# Patient Record
Sex: Female | Born: 1980
Health system: Southern US, Community
[De-identification: ages and names within clinical notes are randomized; demographics above are authoritative.]

## PROBLEM LIST (undated history)

## (undated) DIAGNOSIS — B009 Herpesviral infection, unspecified: Secondary | ICD-10-CM

## (undated) DIAGNOSIS — D241 Benign neoplasm of right breast: Secondary | ICD-10-CM

## (undated) DIAGNOSIS — F32A Depression, unspecified: Secondary | ICD-10-CM

## (undated) DIAGNOSIS — F329 Major depressive disorder, single episode, unspecified: Secondary | ICD-10-CM

## (undated) DIAGNOSIS — D242 Benign neoplasm of left breast: Secondary | ICD-10-CM

## (undated) DIAGNOSIS — Z8619 Personal history of other infectious and parasitic diseases: Secondary | ICD-10-CM

## (undated) DIAGNOSIS — O09519 Supervision of elderly primigravida, unspecified trimester: Secondary | ICD-10-CM

## (undated) HISTORY — DX: Personal history of other infectious and parasitic diseases: Z86.19

## (undated) HISTORY — DX: Herpesviral infection, unspecified: B00.9

## (undated) HISTORY — DX: Major depressive disorder, single episode, unspecified: F32.9

## (undated) HISTORY — DX: Benign neoplasm of right breast: D24.2

## (undated) HISTORY — PX: BREAST BIOPSY: SHX20

## (undated) HISTORY — DX: Benign neoplasm of right breast: D24.1

## (undated) HISTORY — DX: Supervision of elderly primigravida, unspecified trimester: O09.519

## (undated) HISTORY — DX: Depression, unspecified: F32.A

---
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
## 1998-03-20 HISTORY — PX: DILATION AND CURETTAGE OF UTERUS: SHX78

## 1999-03-31 ENCOUNTER — Other Ambulatory Visit: Admission: RE | Admit: 1999-03-31 | Discharge: 1999-03-31 | Payer: Self-pay | Admitting: Obstetrics & Gynecology

## 1999-05-17 ENCOUNTER — Other Ambulatory Visit: Admission: RE | Admit: 1999-05-17 | Discharge: 1999-05-17 | Payer: Self-pay | Admitting: Obstetrics & Gynecology

## 1999-05-17 ENCOUNTER — Encounter (INDEPENDENT_AMBULATORY_CARE_PROVIDER_SITE_OTHER): Payer: Self-pay | Admitting: Specialist

## 1999-08-11 ENCOUNTER — Other Ambulatory Visit: Admission: RE | Admit: 1999-08-11 | Discharge: 1999-08-11 | Payer: Self-pay | Admitting: Obstetrics & Gynecology

## 1999-12-08 ENCOUNTER — Encounter (INDEPENDENT_AMBULATORY_CARE_PROVIDER_SITE_OTHER): Payer: Self-pay | Admitting: Specialist

## 1999-12-08 ENCOUNTER — Other Ambulatory Visit: Admission: RE | Admit: 1999-12-08 | Discharge: 1999-12-08 | Payer: Self-pay | Admitting: Obstetrics & Gynecology

## 2000-02-24 ENCOUNTER — Other Ambulatory Visit: Admission: RE | Admit: 2000-02-24 | Discharge: 2000-02-24 | Payer: Self-pay | Admitting: Obstetrics & Gynecology

## 2000-03-20 HISTORY — PX: WISDOM TOOTH EXTRACTION: SHX21

## 2004-10-05 ENCOUNTER — Other Ambulatory Visit: Admission: RE | Admit: 2004-10-05 | Discharge: 2004-10-05 | Payer: Self-pay | Admitting: Obstetrics & Gynecology

## 2006-09-08 ENCOUNTER — Emergency Department (HOSPITAL_COMMUNITY): Admission: EM | Admit: 2006-09-08 | Discharge: 2006-09-08 | Payer: Self-pay | Admitting: Emergency Medicine

## 2008-01-07 ENCOUNTER — Emergency Department (HOSPITAL_COMMUNITY): Admission: EM | Admit: 2008-01-07 | Discharge: 2008-01-07 | Payer: Self-pay | Admitting: Emergency Medicine

## 2008-03-15 ENCOUNTER — Emergency Department (HOSPITAL_COMMUNITY): Admission: EM | Admit: 2008-03-15 | Discharge: 2008-03-15 | Payer: Self-pay | Admitting: Family Medicine

## 2010-07-04 ENCOUNTER — Other Ambulatory Visit (HOSPITAL_COMMUNITY): Payer: Self-pay | Admitting: Obstetrics and Gynecology

## 2010-08-01 ENCOUNTER — Other Ambulatory Visit: Payer: Self-pay

## 2011-01-04 LAB — STREP A DNA PROBE: Group A Strep Probe: NEGATIVE

## 2011-01-04 LAB — POCT RAPID STREP A: Streptococcus, Group A Screen (Direct): NEGATIVE

## 2011-04-06 ENCOUNTER — Other Ambulatory Visit: Payer: Self-pay | Admitting: Obstetrics and Gynecology

## 2011-04-06 DIAGNOSIS — N63 Unspecified lump in unspecified breast: Secondary | ICD-10-CM

## 2011-04-13 ENCOUNTER — Other Ambulatory Visit (HOSPITAL_COMMUNITY): Payer: Self-pay | Admitting: Obstetrics and Gynecology

## 2011-04-13 ENCOUNTER — Ambulatory Visit
Admission: RE | Admit: 2011-04-13 | Discharge: 2011-04-13 | Disposition: A | Discharge status: Home / Self Care | Payer: BC Managed Care – PPO | Source: Ambulatory Visit | Attending: Obstetrics and Gynecology | Admitting: Obstetrics and Gynecology

## 2012-03-20 HISTORY — PX: BREAST SURGERY: SHX581

## 2012-08-15 ENCOUNTER — Encounter (HOSPITAL_COMMUNITY): Payer: Self-pay | Admitting: *Deleted

## 2012-08-15 ENCOUNTER — Emergency Department (INDEPENDENT_AMBULATORY_CARE_PROVIDER_SITE_OTHER)
Admission: EM | Admit: 2012-08-15 | Discharge: 2012-08-15 | Disposition: A | Discharge status: Home / Self Care | Payer: BC Managed Care – PPO | Source: Home / Self Care

## 2012-08-15 DIAGNOSIS — I889 Nonspecific lymphadenitis, unspecified: Secondary | ICD-10-CM

## 2012-08-15 DIAGNOSIS — S1096XA Insect bite of unspecified part of neck, initial encounter: Secondary | ICD-10-CM

## 2012-08-15 DIAGNOSIS — S0006XA Insect bite (nonvenomous) of scalp, initial encounter: Secondary | ICD-10-CM

## 2012-08-15 MED ORDER — DOXYCYCLINE HYCLATE 100 MG PO CAPS: 100.0000 mg | ORAL_CAPSULE | Freq: Two times a day (BID) | ORAL | Status: DC

## 2012-08-15 NOTE — ED Notes (Signed)
Pt. states NP told her to start the antibiotic if fever, increasing pain, more lymph glands swelling or flu like symptoms.

## 2012-08-15 NOTE — ED Provider Notes (Signed)
History     CSN: [id number]  Arrival date & time 08/15/12  1302   None     No chief complaint on file.   (Consider location/radiation/quality/duration/timing/severity/associated sxs/prior treatment) HPI Comments: 32 year old female presents with the complaint of insidious onset of a tender knot on her occipital scalp. It started approximately 2 days ago. It was larger yesterday however through today it is become smaller. Is associated with local tenderness and mild occipital headache. Interestingly, she states she removed a tick from her scalp approximately 10 cm superior to the area of the nodule. She denies symptoms of illness such as fever, chills, rash, cough, chest pain, shortness of breath or other symptoms. States she feels generally well.   No past medical history on file.  No past surgical history on file.  No family history on file.  History  Substance Use Topics  . Smoking status: Not on file  . Smokeless tobacco: Not on file  . Alcohol Use: Not on file    OB History   No data available      Review of Systems  Constitutional: Negative.   HENT:       There is a small area of tenderness associated with a one half centimeter slightly raised nodule. There is no visible discoloration or other abnormalities of the scalp. No tenderness, pain or other lesions to the remainder of the scalp.  Eyes: Negative.   Respiratory: Negative.   Gastrointestinal: Negative.   Skin: Negative for color change, pallor and rash.  Allergic/Immunologic: Negative for immunocompromised state.  Neurological: Negative.   Psychiatric/Behavioral: Negative.     Allergies  Review of patient's allergies indicates not on file.  Home Medications   Current Outpatient Rx  Name  Route  Sig  Dispense  Refill  . doxycycline (VIBRAMYCIN) 100 MG capsule   Oral   Take 1 capsule (100 mg total) by mouth 2 (two) times daily.   20 capsule   0     BP 123/77  Pulse 68  Temp(Src) 98.9 F (37.2  C) (Oral)  Resp 17  SpO2 97%  Physical Exam  Nursing note and vitals reviewed. Constitutional: She is oriented to person, place, and time. She appears well-developed and well-nourished. No distress.  HENT:  Head: Normocephalic and atraumatic.  Mouth/Throat: Oropharynx is clear and moist. No oropharyngeal exudate.  Bilateral TMs are normal  Eyes: EOM are normal. Pupils are equal, round, and reactive to light.  Neck: Normal range of motion. Neck supple.  No posterior or  anterior cervical lymphadenopathy .  Cardiovascular: Normal rate.   Pulmonary/Chest: Effort normal and breath sounds normal.  Musculoskeletal: Normal range of motion. She exhibits no edema.  Lymphadenopathy:    She has no cervical adenopathy.  Neurological: She is alert and oriented to person, place, and time. She exhibits normal muscle tone.  Skin: Skin is warm and dry. No rash noted. No erythema.  Psychiatric: She has a normal mood and affect.    ED Course  Procedures (including critical care time)  Labs Reviewed - No data to display No results found.   1. Occipital lymphadenitis   2. Tick bite of scalp, initial encounter       MDM  This is most likely a reactive lymphadenitis of a solitary occipital node due to a recent tick bite of the scalp. She may apply warm compresses. She is also instructed on symptoms of tick born illness and should seek medical attention if she becomes ill develops fever or has  any other problems. I gave her a prescription for doxycycline 100 mg twice a day should there be additional lymph nodes 2 enlarged or the solitary lymph node enlarges becomes red or more painful. She is also start the antibiotic if she becomes ill or develops fever in addition to coming back for recheck.  Hayden Rasmussen, NP 08/15/12 [phone]

## 2012-08-15 NOTE — ED Notes (Signed)
C/o  knot on back of head on R side at the base of the skull onset yesterday evening.  States it is painful and nagging headache.  Had a tick bite last Friday 5/23 3 inches above the knot.Marland Kitchen

## 2012-08-15 NOTE — ED Provider Notes (Signed)
Medical screening examination/treatment/procedure(s) were performed by non-physician practitioner and as supervising physician I was immediately available for consultation/collaboration.  Leslee Home, M.D.  Reuben Likes, MD 08/15/12 2030

## 2012-08-26 ENCOUNTER — Other Ambulatory Visit: Payer: Self-pay | Admitting: Obstetrics and Gynecology

## 2012-08-26 ENCOUNTER — Emergency Department (INDEPENDENT_AMBULATORY_CARE_PROVIDER_SITE_OTHER)
Admission: EM | Admit: 2012-08-26 | Discharge: 2012-08-26 | Disposition: A | Discharge status: Home / Self Care | Payer: BC Managed Care – PPO | Source: Home / Self Care

## 2012-08-26 ENCOUNTER — Encounter (HOSPITAL_COMMUNITY): Payer: Self-pay | Admitting: Emergency Medicine

## 2012-08-26 DIAGNOSIS — T148XXA Other injury of unspecified body region, initial encounter: Secondary | ICD-10-CM

## 2012-08-26 DIAGNOSIS — S29012A Strain of muscle and tendon of back wall of thorax, initial encounter: Secondary | ICD-10-CM

## 2012-08-26 DIAGNOSIS — S239XXA Sprain of unspecified parts of thorax, initial encounter: Secondary | ICD-10-CM

## 2012-08-26 DIAGNOSIS — N63 Unspecified lump in unspecified breast: Secondary | ICD-10-CM

## 2012-08-26 MED ORDER — DICLOFENAC POTASSIUM 50 MG PO TABS: 50.0000 mg | ORAL_TABLET | Freq: Three times a day (TID) | ORAL | Status: DC

## 2012-08-26 NOTE — ED Provider Notes (Signed)
History     CSN: [id number]  Arrival date & time 08/26/12  1013   First MD Initiated Contact with Patient 08/26/12 1032      Chief Complaint  Patient presents with  . Shoulder Pain    (Consider location/radiation/quality/duration/timing/severity/associated sxs/prior treatment) HPI Comments: Pleasant 32 year old female states she felt a pinch in her left upper back last night while lying in bed. It did not seem to bother her much at that time however this morning when she was getting dressed she reached behind her back and felt a pop in her left upper back. She is complaining of pain primarily to the upper parathoracic musculature. The movement but primarily produces pain is stretching the left arm forward and separating the scapula. Denies other injury or trauma.   History reviewed. No pertinent past medical history.  Past Surgical History  Procedure Laterality Date  . Finger surgery Right 1986    R index finger -tip amputed in door. Sewn up.  . Wisdom tooth extraction  2002  . Dilation and curettage of uterus  2000    Family History  Problem Relation Age of Onset  . Diabetes Mother   . Heart disease Father     6 vessel bypass, 2 aneurysms ( aorta and abd.)    History  Substance Use Topics  . Smoking status: Current Every Day Smoker -- 0.50 packs/day    Types: Cigarettes  . Smokeless tobacco: Not on file  . Alcohol Use: 2.4 oz/week    4 Cans of beer per week    OB History   Grav Para Term Preterm Abortions TAB SAB Ect Mult Living                  Review of Systems  Constitutional: Negative for fever, chills and activity change.  HENT: Negative.   Respiratory: Negative.   Cardiovascular: Negative.   Musculoskeletal:       As per HPI  Skin: Negative for color change, pallor and rash.  Neurological: Negative.     Allergies  Review of patient's allergies indicates no known allergies.  Home Medications   Current Outpatient Rx  Name  Route  Sig  Dispense   Refill  . ibuprofen (ADVIL,MOTRIN) 200 MG tablet   Oral   Take 200 mg by mouth every 6 (six) hours as needed for pain.         Marland Kitchen diclofenac (CATAFLAM) 50 MG tablet   Oral   Take 1 tablet (50 mg total) by mouth 3 (three) times daily.   20 tablet   0   . doxycycline (VIBRAMYCIN) 100 MG capsule   Oral   Take 1 capsule (100 mg total) by mouth 2 (two) times daily.   20 capsule   0   . norgestimate-ethinyl estradiol (ORTHO-CYCLEN,SPRINTEC,PREVIFEM) 0.25-35 MG-MCG tablet   Oral   Take 1 tablet by mouth daily.           BP 144/93  Pulse 62  Temp(Src) 98.7 F (37.1 C) (Oral)  Resp 16  SpO2 100%  LMP 07/30/2012  Physical Exam  Constitutional: She is oriented to person, place, and time. She appears well-developed and well-nourished. No distress.  HENT:  Head: Normocephalic and atraumatic.  Eyes: EOM are normal.  Neck: Normal range of motion. Neck supple.  Cardiovascular: Normal rate.   Pulmonary/Chest: Effort normal. No respiratory distress.  Musculoskeletal:  Tenderness to the upper mid para thoracic musculature on the left. There is tenderness involving the medial aspect of the trapezius  and the rhomboid muscle. No overlying swelling for color change.  Neurological: She is alert and oriented to person, place, and time. No cranial nerve deficit.  Skin: Skin is warm and dry.  Psychiatric: She has a normal mood and affect.    ED Course  Procedures (including critical care time)  Labs Reviewed - No data to display No results found.   1. Upper back strain, initial encounter   2. Muscle strain       MDM  Stretches as demonstrated Heat Limit activity that exacerbates pain cataflam 50 tid prn , with food.         Hayden Rasmussen, NP 08/26/12 1128

## 2012-08-26 NOTE — ED Notes (Signed)
Noticed a "pinch" last night : noticed this on left back, between spine and shoulder blade.  This am was scrubbing back with right arm over head, brought right arm down to her side and had "shooting " pain in left back.  Patient is right handed.  No known injury, no different activities.  Did sleep in a different bed Friday night

## 2012-08-26 NOTE — ED Notes (Signed)
Printed work note

## 2012-08-26 NOTE — ED Provider Notes (Signed)
Medical screening examination/treatment/procedure(s) were performed by non-physician practitioner and as supervising physician I was immediately available for consultation/collaboration.   MORENO-COLL,Lorna Strother; MD  Tess Potts Moreno-Coll, MD 08/26/12 1545 

## 2012-09-12 ENCOUNTER — Other Ambulatory Visit: Payer: BC Managed Care – PPO

## 2012-09-13 ENCOUNTER — Ambulatory Visit
Admission: RE | Admit: 2012-09-13 | Discharge: 2012-09-13 | Disposition: A | Discharge status: Home / Self Care | Payer: BC Managed Care – PPO | Source: Ambulatory Visit | Attending: Obstetrics and Gynecology | Admitting: Obstetrics and Gynecology

## 2012-09-13 ENCOUNTER — Other Ambulatory Visit: Payer: Self-pay | Admitting: Obstetrics and Gynecology

## 2012-09-13 DIAGNOSIS — N63 Unspecified lump in unspecified breast: Secondary | ICD-10-CM

## 2012-09-16 ENCOUNTER — Encounter (HOSPITAL_COMMUNITY): Payer: Self-pay | Admitting: Emergency Medicine

## 2012-09-16 ENCOUNTER — Emergency Department (HOSPITAL_COMMUNITY)
Admission: EM | Admit: 2012-09-16 | Discharge: 2012-09-16 | Discharge status: Against Medical Advice | Payer: BC Managed Care – PPO | Attending: Emergency Medicine | Admitting: Emergency Medicine

## 2012-09-16 DIAGNOSIS — L500 Allergic urticaria: Secondary | ICD-10-CM | POA: Insufficient documentation

## 2012-09-16 NOTE — ED Notes (Signed)
PT. REPORTS ALLERGIC REACTION TO INSECT BITE THIS EVENING , PRESENTS WITH RASHES  /HIVES AT BACK AND THIGHS . RESPIRATIONS UNLABORED / AIRWAY INTACT .

## 2012-09-24 ENCOUNTER — Ambulatory Visit
Admission: RE | Admit: 2012-09-24 | Discharge: 2012-09-24 | Disposition: A | Discharge status: Home / Self Care | Payer: BC Managed Care – PPO | Source: Ambulatory Visit | Attending: Obstetrics and Gynecology | Admitting: Obstetrics and Gynecology

## 2012-09-24 ENCOUNTER — Other Ambulatory Visit: Payer: Self-pay | Admitting: Obstetrics and Gynecology

## 2012-09-24 DIAGNOSIS — N63 Unspecified lump in unspecified breast: Secondary | ICD-10-CM

## 2012-09-27 ENCOUNTER — Other Ambulatory Visit: Payer: BC Managed Care – PPO

## 2013-01-23 ENCOUNTER — Other Ambulatory Visit: Payer: Self-pay | Admitting: Obstetrics and Gynecology

## 2013-04-22 ENCOUNTER — Encounter (HOSPITAL_COMMUNITY): Payer: Self-pay | Admitting: Emergency Medicine

## 2013-04-22 ENCOUNTER — Emergency Department (INDEPENDENT_AMBULATORY_CARE_PROVIDER_SITE_OTHER)
Admission: EM | Admit: 2013-04-22 | Discharge: 2013-04-22 | Disposition: A | Discharge status: Home / Self Care | Payer: BC Managed Care – PPO | Source: Home / Self Care | Attending: Family Medicine | Admitting: Family Medicine

## 2013-04-22 DIAGNOSIS — IMO0002 Reserved for concepts with insufficient information to code with codable children: Secondary | ICD-10-CM

## 2013-04-22 DIAGNOSIS — S29011A Strain of muscle and tendon of front wall of thorax, initial encounter: Secondary | ICD-10-CM

## 2013-04-22 MED ORDER — DICLOFENAC 35 MG PO CAPS: 35.0000 mg | ORAL_CAPSULE | Freq: Three times a day (TID) | ORAL | Status: DC

## 2013-04-22 NOTE — ED Notes (Signed)
C/o mid chest strain States pain radiates under armpits cold compresses and advil as tx Does pick up heavy items at work

## 2013-04-22 NOTE — ED Provider Notes (Signed)
CSN: [id number]     Arrival date & time 04/22/13  1559 History   First MD Initiated Contact with Patient 04/22/13 1637     Chief Complaint  Patient presents with  . chest strain    (Consider location/radiation/quality/duration/timing/severity/associated sxs/prior Treatment) Patient is a 32 y.o. female presenting with chest pain. The history is provided by the patient.  Chest Pain Pain location:  L chest and R chest Pain quality: sharp   Pain radiates to:  Upper back Pain radiates to the back: yes   Pain severity:  Mild Onset quality:  Gradual Duration:  2 weeks Progression:  Unchanged Context: lifting and raising an arm   Relieved by:  Certain positions Worsened by:  Certain positions Ineffective treatments:  None tried Associated symptoms: cough   Associated symptoms: no fever, no nausea, no palpitations, no shortness of breath and not vomiting   Risk factors: smoking     History reviewed. No pertinent past medical history. Past Surgical History  Procedure Laterality Date  . Finger surgery Right 1986    R index finger -tip amputed in door. Sewn up.  . Wisdom tooth extraction  2002  . Dilation and curettage of uterus  2000   Family History  Problem Relation Age of Onset  . Diabetes Mother   . Heart disease Father     6 vessel bypass, 2 aneurysms ( aorta and abd.)   History  Substance Use Topics  . Smoking status: Current Every Day Smoker -- 0.50 packs/day    Types: Cigarettes  . Smokeless tobacco: Not on file  . Alcohol Use: 2.4 oz/week    4 Cans of beer per week   OB History   Grav Para Term Preterm Abortions TAB SAB Ect Mult Living                 Review of Systems  Constitutional: Negative.  Negative for fever.  HENT: Negative.   Respiratory: Positive for cough. Negative for shortness of breath and wheezing.   Cardiovascular: Positive for chest pain. Negative for palpitations and leg swelling.  Gastrointestinal: Negative for nausea and vomiting.     Allergies  Review of patient's allergies indicates no known allergies.  Home Medications   Current Outpatient Rx  Name  Route  Sig  Dispense  Refill  . Diclofenac (ZORVOLEX) 35 MG CAPS   Oral   Take 35 mg by mouth 3 (three) times daily after meals.   30 capsule   5   . norgestimate-ethinyl estradiol (ORTHO-CYCLEN,SPRINTEC,PREVIFEM) 0.25-35 MG-MCG tablet   Oral   Take 1 tablet by mouth at bedtime.           BP 123/77  Pulse 66  Temp(Src) 98.7 F (37.1 C) (Oral)  Resp 16  SpO2 100%  LMP 04/21/2013 Physical Exam  Nursing note and vitals reviewed. Constitutional: She is oriented to person, place, and time. She appears well-developed and well-nourished.  HENT:  Right Ear: External ear normal.  Left Ear: External ear normal.  Mouth/Throat: Oropharynx is clear and moist.  Neck: Normal range of motion. Neck supple.  Cardiovascular: Normal rate, regular rhythm, normal heart sounds and intact distal pulses.   Pulmonary/Chest: Effort normal and breath sounds normal. No respiratory distress. She has no wheezes. She has no rales. She exhibits tenderness.  Abdominal: Soft. Bowel sounds are normal. There is no tenderness.  Lymphadenopathy:    She has no cervical adenopathy.  Neurological: She is alert and oriented to person, place, and time.  Skin: Skin  is warm and dry.    ED Course  Procedures (including critical care time) Labs Review Labs Reviewed - No data to display Imaging Review No results found.    MDM   1. Muscle strain of chest wall        Billy Fischer, MD 04/22/13 [phone]

## 2013-08-15 ENCOUNTER — Ambulatory Visit: Payer: Self-pay | Admitting: Podiatrist

## 2013-10-21 LAB — OB RESULTS CONSOLE RPR: RPR: NONREACTIVE

## 2013-10-21 LAB — OB RESULTS CONSOLE ABO/RH: RH Type: POSITIVE

## 2013-10-21 LAB — OB RESULTS CONSOLE ANTIBODY SCREEN: ANTIBODY SCREEN: NEGATIVE

## 2013-10-21 LAB — OB RESULTS CONSOLE HIV ANTIBODY (ROUTINE TESTING): HIV: NONREACTIVE

## 2013-10-21 LAB — OB RESULTS CONSOLE HEPATITIS B SURFACE ANTIGEN: Hepatitis B Surface Ag: NEGATIVE

## 2013-10-21 LAB — OB RESULTS CONSOLE GC/CHLAMYDIA
Chlamydia: NEGATIVE
GC PROBE AMP, GENITAL: NEGATIVE

## 2013-10-21 LAB — OB RESULTS CONSOLE RUBELLA ANTIBODY, IGM: RUBELLA: IMMUNE

## 2014-05-27 ENCOUNTER — Telehealth (HOSPITAL_COMMUNITY): Payer: Self-pay | Admitting: *Deleted

## 2014-05-27 ENCOUNTER — Encounter (HOSPITAL_COMMUNITY): Payer: Self-pay | Admitting: *Deleted

## 2014-05-27 LAB — OB RESULTS CONSOLE GBS: GBS: NEGATIVE

## 2014-05-27 NOTE — Telephone Encounter (Signed)
Preadmission screen  

## 2014-05-29 ENCOUNTER — Inpatient Hospital Stay (HOSPITAL_COMMUNITY): Admission: AD | Admit: 2014-05-29 | Payer: Self-pay | Source: Ambulatory Visit | Admitting: Obstetrics and Gynecology

## 2014-06-04 ENCOUNTER — Encounter (HOSPITAL_COMMUNITY): Payer: Self-pay

## 2014-06-04 ENCOUNTER — Inpatient Hospital Stay (HOSPITAL_COMMUNITY)
Admission: RE | Admit: 2014-06-04 | Discharge: 2014-06-07 | DRG: 775 | Disposition: A | Discharge status: Home / Self Care | Payer: BLUE CROSS/BLUE SHIELD | Source: Ambulatory Visit | Attending: Obstetrics and Gynecology | Admitting: Obstetrics and Gynecology

## 2014-06-04 DIAGNOSIS — Z87891 Personal history of nicotine dependence: Secondary | ICD-10-CM | POA: Diagnosis not present

## 2014-06-04 DIAGNOSIS — O09523 Supervision of elderly multigravida, third trimester: Secondary | ICD-10-CM | POA: Diagnosis not present

## 2014-06-04 DIAGNOSIS — O480 Post-term pregnancy: Principal | ICD-10-CM | POA: Diagnosis present

## 2014-06-04 DIAGNOSIS — Z3A40 40 weeks gestation of pregnancy: Secondary | ICD-10-CM | POA: Diagnosis present

## 2014-06-04 DIAGNOSIS — Z833 Family history of diabetes mellitus: Secondary | ICD-10-CM

## 2014-06-04 LAB — CBC
HCT: 34.6 % — ABNORMAL LOW (ref 36.0–46.0)
Hemoglobin: 11.7 g/dL — ABNORMAL LOW (ref 12.0–15.0)
MCH: 30.1 pg (ref 26.0–34.0)
MCHC: 33.8 g/dL (ref 30.0–36.0)
MCV: 88.9 fL (ref 78.0–100.0)
PLATELETS: 295 10*3/uL (ref 150–400)
RBC: 3.89 MIL/uL (ref 3.87–5.11)
RDW: 14 % (ref 11.5–15.5)
WBC: 9.4 10*3/uL (ref 4.0–10.5)

## 2014-06-04 MED ORDER — OXYTOCIN 40 UNITS IN LACTATED RINGERS INFUSION - SIMPLE MED
62.5000 mL/h | INTRAVENOUS | Status: DC
  Filled 2014-06-04: qty 1000

## 2014-06-04 MED ORDER — ACETAMINOPHEN 325 MG PO TABS: 650.0000 mg | ORAL_TABLET | ORAL | Status: DC | PRN

## 2014-06-04 MED ORDER — OXYCODONE-ACETAMINOPHEN 5-325 MG PO TABS: 1.0000 | ORAL_TABLET | ORAL | Status: DC | PRN

## 2014-06-04 MED ORDER — LACTATED RINGERS IV SOLN
INTRAVENOUS | Status: DC
  Administered 2014-06-04: 21:00:00 via INTRAVENOUS

## 2014-06-04 MED ORDER — FLEET ENEMA 7-19 GM/118ML RE ENEM: 1.0000 | ENEMA | RECTAL | Status: DC | PRN

## 2014-06-04 MED ORDER — OXYCODONE-ACETAMINOPHEN 5-325 MG PO TABS
2.0000 | ORAL_TABLET | ORAL | Status: DC | PRN
Start: 2014-06-04 — End: 2014-06-06

## 2014-06-04 MED ORDER — MISOPROSTOL 25 MCG QUARTER TABLET
25.0000 ug | ORAL_TABLET | ORAL | Status: DC | PRN
  Administered 2014-06-04 – 2014-06-05 (×3): 25 ug via VAGINAL
  Filled 2014-06-04: qty 1
  Filled 2014-06-04 (×3): qty 0.25

## 2014-06-04 MED ORDER — ONDANSETRON HCL 4 MG/2ML IJ SOLN: 4.0000 mg | Freq: Four times a day (QID) | INTRAMUSCULAR | Status: DC | PRN

## 2014-06-04 MED ORDER — OXYTOCIN BOLUS FROM INFUSION: 500.0000 mL | INTRAVENOUS | Status: DC

## 2014-06-04 MED ORDER — BUTORPHANOL TARTRATE 1 MG/ML IJ SOLN: 1.0000 mg | INTRAMUSCULAR | Status: DC | PRN

## 2014-06-04 MED ORDER — TERBUTALINE SULFATE 1 MG/ML IJ SOLN: 0.2500 mg | Freq: Once | INTRAMUSCULAR | Status: AC | PRN

## 2014-06-04 MED ORDER — ZOLPIDEM TARTRATE 5 MG PO TABS
5.0000 mg | ORAL_TABLET | Freq: Every evening | ORAL | Status: DC | PRN
  Administered 2014-06-05: 5 mg via ORAL
  Filled 2014-06-04: qty 1

## 2014-06-04 MED ORDER — LACTATED RINGERS IV SOLN: 500.0000 mL | INTRAVENOUS | Status: DC | PRN

## 2014-06-04 MED ORDER — CITRIC ACID-SODIUM CITRATE 334-500 MG/5ML PO SOLN
30.0000 mL | ORAL | Status: DC | PRN
  Administered 2014-06-05: 30 mL via ORAL
  Filled 2014-06-04: qty 15

## 2014-06-04 MED ORDER — LIDOCAINE HCL (PF) 1 % IJ SOLN
30.0000 mL | INTRAMUSCULAR | Status: DC | PRN
  Filled 2014-06-04: qty 30

## 2014-06-04 NOTE — H&P (Signed)
Maria Vega is a 34 y.o. female presenting for IOL for post dates. Denies H/A and vision change. Maternal Medical History:  Fetal activity: Perceived fetal activity is normal.      OB History    Gravida Para Term Preterm AB TAB SAB Ectopic Multiple Living   2    1          Past Medical History  Diagnosis Date  . Herpes   . Hx of varicella   . History of shingles   . Bilateral fibroadenomas of breasts   . AMA (advanced maternal age) primigravida 101+    Past Surgical History  Procedure Laterality Date  . Finger surgery Right 1986    R index finger -tip amputed in door. Sewn up.  . Wisdom tooth extraction  2002  . Dilation and curettage of uterus  2000   Family History: family history includes Cancer in her father and mother; Diabetes in her mother; Heart disease in her father. Social History:  reports that she quit smoking about 7 months ago. Her smoking use included Cigarettes. She smoked 0.50 packs per day. She does not have any smokeless tobacco history on file. She reports that she drinks about 2.4 oz of alcohol per week. She reports that she does not use illicit drugs.   Prenatal Transfer Tool  Maternal Diabetes: No Genetic Screening: Normal Maternal Ultrasounds/Referrals: Normal Fetal Ultrasounds or other Referrals:  None Maternal Substance Abuse:  No Significant Maternal Medications:  None Significant Maternal Lab Results:  None Other Comments:  None  Review of Systems  Eyes: Negative for blurred vision.  Gastrointestinal: Negative for abdominal pain.  Neurological: Negative for headaches.    Dilation: Fingertip Effacement (%): Thick Station: -3 Exam by:: [email or web address],VE Blood pressure 131/86, pulse 92, temperature 97.7 F (36.5 C), temperature source Oral, resp. rate 16, height 5\' 7"  (1.702 m), weight 209 lb (94.802 kg), last menstrual period 08/22/2013.   Fetal Exam Fetal State Assessment: Category I - tracings are normal.     Physical Exam   Cardiovascular: Normal rate.   Respiratory: Effort normal.    Prenatal labs: ABO, Rh: A/Positive/-- (08/04 0000) Antibody: Negative (08/04 0000) Rubella: Immune (08/04 0000) RPR: Nonreactive (08/04 0000)  HBsAg: Negative (08/04 0000)  HIV: Non-reactive (08/04 0000)  GBS: Negative (03/09 0000)   Assessment/Plan: 35 yo G2P0 at 27 6/7 weeks  D/W patient and husband 2 stage induction with risks including fetal distress, emergency cesarean section, failed induction. All questions answered. Patient states she understands and agrees   Benjiman Core E 06/04/2014, 9:39 PM

## 2014-06-05 ENCOUNTER — Inpatient Hospital Stay (HOSPITAL_COMMUNITY): Payer: BLUE CROSS/BLUE SHIELD | Admitting: Anesthesiology

## 2014-06-05 LAB — ABO/RH: ABO/RH(D): A POS

## 2014-06-05 LAB — TYPE AND SCREEN
ABO/RH(D): A POS
ANTIBODY SCREEN: NEGATIVE

## 2014-06-05 LAB — RPR: RPR Ser Ql: NONREACTIVE

## 2014-06-05 MED ORDER — LACTATED RINGERS IV SOLN
500.0000 mL | Freq: Once | INTRAVENOUS | Status: AC
  Administered 2014-06-05: 500 mL via INTRAVENOUS

## 2014-06-05 MED ORDER — EPHEDRINE 5 MG/ML INJ
10.0000 mg | INTRAVENOUS | Status: DC | PRN
  Filled 2014-06-05: qty 2

## 2014-06-05 MED ORDER — FENTANYL 2.5 MCG/ML BUPIVACAINE 1/10 % EPIDURAL INFUSION (WH - ANES)
14.0000 mL/h | INTRAMUSCULAR | Status: DC | PRN
  Administered 2014-06-05 (×2): 14 mL/h via EPIDURAL
  Filled 2014-06-05 (×2): qty 125

## 2014-06-05 MED ORDER — DIPHENHYDRAMINE HCL 50 MG/ML IJ SOLN: 12.5000 mg | INTRAMUSCULAR | Status: DC | PRN

## 2014-06-05 MED ORDER — PHENYLEPHRINE 40 MCG/ML (10ML) SYRINGE FOR IV PUSH (FOR BLOOD PRESSURE SUPPORT)
80.0000 ug | PREFILLED_SYRINGE | INTRAVENOUS | Status: DC | PRN
  Filled 2014-06-05: qty 2
  Filled 2014-06-05: qty 20

## 2014-06-05 MED ORDER — OXYTOCIN 40 UNITS IN LACTATED RINGERS INFUSION - SIMPLE MED
1.0000 m[IU]/min | INTRAVENOUS | Status: DC
  Administered 2014-06-05: 2 m[IU]/min via INTRAVENOUS

## 2014-06-05 MED ORDER — TERBUTALINE SULFATE 1 MG/ML IJ SOLN: 0.2500 mg | Freq: Once | INTRAMUSCULAR | Status: AC | PRN

## 2014-06-05 MED ORDER — FENTANYL 2.5 MCG/ML BUPIVACAINE 1/10 % EPIDURAL INFUSION (WH - ANES)
INTRAMUSCULAR | Status: DC | PRN
  Administered 2014-06-05: 14 mL/h via EPIDURAL

## 2014-06-05 MED ORDER — PHENYLEPHRINE 40 MCG/ML (10ML) SYRINGE FOR IV PUSH (FOR BLOOD PRESSURE SUPPORT)
80.0000 ug | PREFILLED_SYRINGE | INTRAVENOUS | Status: DC | PRN
  Filled 2014-06-05: qty 2

## 2014-06-05 MED ORDER — LIDOCAINE HCL (PF) 1 % IJ SOLN
INTRAMUSCULAR | Status: DC | PRN
  Administered 2014-06-05 (×2): 4 mL

## 2014-06-05 NOTE — Progress Notes (Signed)
Pt comfortable w/ epidural.  Feeling a bit of pressure  FHT reassuring w/ good variablility.  Occasional decel Toco Q2 Cvx 9.5cm/C/+1  A/P:  Will recheck in 30 min and start pushing if complete.

## 2014-06-05 NOTE — Anesthesia Preprocedure Evaluation (Signed)
Anesthesia Evaluation  Patient identified by MRN, date of birth, ID band Patient awake    Reviewed: Allergy & Precautions, NPO status , Patient's Chart, lab work & pertinent test results  History of Anesthesia Complications Negative for: history of anesthetic complications  Airway Mallampati: II  TM Distance: >3 FB Neck ROM: Full    Dental no notable dental hx. (+) Dental Advisory Given   Pulmonary former smoker,  breath sounds clear to auscultation  Pulmonary exam normal       Cardiovascular negative cardio ROS  Rhythm:Regular Rate:Normal     Neuro/Psych negative neurological ROS  negative psych ROS   GI/Hepatic negative GI ROS, Neg liver ROS,   Endo/Other  obesity  Renal/GU negative Renal ROS  negative genitourinary   Musculoskeletal negative musculoskeletal ROS (+)   Abdominal   Peds negative pediatric ROS (+)  Hematology negative hematology ROS (+)   Anesthesia Other Findings   Reproductive/Obstetrics (+) Pregnancy                             Anesthesia Physical Anesthesia Plan  ASA: II  Anesthesia Plan: Epidural   Post-op Pain Management:    Induction:   Airway Management Planned:   Additional Equipment:   Intra-op Plan:   Post-operative Plan:   Informed Consent: I have reviewed the patients History and Physical, chart, labs and discussed the procedure including the risks, benefits and alternatives for the proposed anesthesia with the patient or authorized representative who has indicated his/her understanding and acceptance.   Dental advisory given  Plan Discussed with: CRNA  Anesthesia Plan Comments:         Anesthesia Quick Evaluation

## 2014-06-05 NOTE — Progress Notes (Signed)
Pt comfortable w/ epidural.  FHT with period of repeatative late decels.  Pitocin turned off and FHT now reassuring w/ good BTBV & accels  FHT reassuring now without pitocin Toco quiet now/irregular Cvx 3/90/-1  A/P:  Plan to turn pitocin back on.  If late decels return - rec c-section. Otherwise exp mngt, watch FHT closely Plan of care d/w pt

## 2014-06-05 NOTE — Progress Notes (Signed)
Pt with mild cramping after 2 doses cytotec  FHT reassuring, cat 1 Toco irregular Cvx 1cm  A/P:  2 stage IOL, postdates Cervix unfavorable, continue cytotec Epidural prn

## 2014-06-05 NOTE — Anesthesia Procedure Notes (Signed)
Epidural Patient location during procedure: OB Start time: 06/05/2014 2:50 PM  Staffing Anesthesiologist: Lauretta Grill Performed by: anesthesiologist   Preanesthetic Checklist Completed: patient identified, site marked, surgical consent, pre-op evaluation, timeout performed, IV checked, risks and benefits discussed and monitors and equipment checked  Epidural Patient position: sitting Prep: site prepped and draped and DuraPrep Patient monitoring: continuous pulse ox and blood pressure Approach: midline Location: L3-L4 Injection technique: LOR saline  Needle:  Needle type: Tuohy  Needle gauge: 17 G Needle length: 9 cm and 9 Needle insertion depth: 7 cm Catheter type: closed end flexible Catheter size: 19 Gauge Catheter at skin depth: 12 cm Test dose: negative  Assessment Events: blood not aspirated, injection not painful, no injection resistance, negative IV test and no paresthesia  Additional Notes Patient identified. Risks/Benefits/Options discussed with patient including but not limited to bleeding, infection, nerve damage, paralysis, failed block, incomplete pain control, headache, blood pressure changes, nausea, vomiting, reactions to medication both or allergic, itching and postpartum back pain. Confirmed with bedside nurse the patient's most recent platelet count. Confirmed with patient that they are not currently taking any anticoagulation, have any bleeding history or any family history of bleeding disorders. Patient expressed understanding and wished to proceed. All questions were answered. Sterile technique was used throughout the entire procedure. Please see nursing notes for vital signs. Test dose was given through epidural catheter and negative prior to continuing to dose epidural or start infusion. Warning signs of high block given to the patient including shortness of breath, tingling/numbness in hands, complete motor block, or any concerning symptoms with instructions to  call for help. Patient was given instructions on fall risk and not to get out of bed. All questions and concerns addressed with instructions to call with any issues or inadequate analgesia.

## 2014-06-05 NOTE — Progress Notes (Signed)
SVD of vigorous female infant w/ apgars of 9,9.  Placenta delivered spontaneous w/ 3VC.   2nd degree lac repaired w/ 3-0 vicryl rapide.  Fundus firm.  EBL 2nd .

## 2014-06-05 NOTE — Plan of Care (Signed)
Dr. Julien Girt here aware of pt UC pattern OK to place next cytotec

## 2014-06-05 NOTE — Progress Notes (Signed)
Pt uncomfortable with ctx  FHT reassuring Toco Q2-3 Cvx 2/70/-2 AROM - clear  A/P:  Plan for epidural Pitocin prn

## 2014-06-06 ENCOUNTER — Encounter (HOSPITAL_COMMUNITY): Payer: Self-pay

## 2014-06-06 LAB — CBC
HCT: 31.4 % — ABNORMAL LOW (ref 36.0–46.0)
Hemoglobin: 10.6 g/dL — ABNORMAL LOW (ref 12.0–15.0)
MCH: 30 pg (ref 26.0–34.0)
MCHC: 33.8 g/dL (ref 30.0–36.0)
MCV: 89 fL (ref 78.0–100.0)
Platelets: 225 10*3/uL (ref 150–400)
RBC: 3.53 MIL/uL — ABNORMAL LOW (ref 3.87–5.11)
RDW: 13.9 % (ref 11.5–15.5)
WBC: 15.4 10*3/uL — ABNORMAL HIGH (ref 4.0–10.5)

## 2014-06-06 MED ORDER — IBUPROFEN 600 MG PO TABS
600.0000 mg | ORAL_TABLET | Freq: Four times a day (QID) | ORAL | Status: DC
  Administered 2014-06-06 – 2014-06-07 (×6): 600 mg via ORAL
  Filled 2014-06-06 (×6): qty 1

## 2014-06-06 MED ORDER — SENNOSIDES-DOCUSATE SODIUM 8.6-50 MG PO TABS
2.0000 | ORAL_TABLET | ORAL | Status: DC
  Administered 2014-06-06 (×2): 2 via ORAL
  Filled 2014-06-06 (×2): qty 2

## 2014-06-06 MED ORDER — MEASLES, MUMPS & RUBELLA VAC ~~LOC~~ INJ
0.5000 mL | INJECTION | Freq: Once | SUBCUTANEOUS | Status: DC
  Filled 2014-06-06: qty 0.5

## 2014-06-06 MED ORDER — PRENATAL MULTIVITAMIN CH
1.0000 | ORAL_TABLET | Freq: Every day | ORAL | Status: DC
  Administered 2014-06-06 – 2014-06-07 (×2): 1 via ORAL
  Filled 2014-06-06 (×2): qty 1

## 2014-06-06 MED ORDER — SIMETHICONE 80 MG PO CHEW: 80.0000 mg | CHEWABLE_TABLET | ORAL | Status: DC | PRN

## 2014-06-06 MED ORDER — OXYCODONE-ACETAMINOPHEN 5-325 MG PO TABS
1.0000 | ORAL_TABLET | ORAL | Status: DC | PRN
Start: 2014-06-06 — End: 2014-06-07
  Administered 2014-06-06 – 2014-06-07 (×2): 1 via ORAL
  Filled 2014-06-06 (×2): qty 1

## 2014-06-06 MED ORDER — BENZOCAINE-MENTHOL 20-0.5 % EX AERO
1.0000 "application " | INHALATION_SPRAY | CUTANEOUS | Status: DC | PRN
  Administered 2014-06-06: 1 via TOPICAL
  Filled 2014-06-06: qty 56

## 2014-06-06 MED ORDER — ONDANSETRON HCL 4 MG/2ML IJ SOLN
4.0000 mg | INTRAMUSCULAR | Status: DC | PRN
Start: 2014-06-06 — End: 2014-06-07

## 2014-06-06 MED ORDER — ACETAMINOPHEN 325 MG PO TABS: 650.0000 mg | ORAL_TABLET | ORAL | Status: DC | PRN

## 2014-06-06 MED ORDER — TETANUS-DIPHTH-ACELL PERTUSSIS 5-2.5-18.5 LF-MCG/0.5 IM SUSP: 0.5000 mL | Freq: Once | INTRAMUSCULAR | Status: DC

## 2014-06-06 MED ORDER — WITCH HAZEL-GLYCERIN EX PADS
1.0000 "application " | MEDICATED_PAD | CUTANEOUS | Status: DC | PRN
  Administered 2014-06-06: 1 via TOPICAL

## 2014-06-06 MED ORDER — LANOLIN HYDROUS EX OINT: TOPICAL_OINTMENT | CUTANEOUS | Status: DC | PRN

## 2014-06-06 MED ORDER — ONDANSETRON HCL 4 MG PO TABS: 4.0000 mg | ORAL_TABLET | ORAL | Status: DC | PRN

## 2014-06-06 MED ORDER — OXYCODONE-ACETAMINOPHEN 5-325 MG PO TABS: 2.0000 | ORAL_TABLET | ORAL | Status: DC | PRN

## 2014-06-06 MED ORDER — DIPHENHYDRAMINE HCL 25 MG PO CAPS: 25.0000 mg | ORAL_CAPSULE | Freq: Four times a day (QID) | ORAL | Status: DC | PRN

## 2014-06-06 MED ORDER — ZOLPIDEM TARTRATE 5 MG PO TABS: 5.0000 mg | ORAL_TABLET | Freq: Every evening | ORAL | Status: DC | PRN

## 2014-06-06 MED ORDER — DIBUCAINE 1 % RE OINT
1.0000 "application " | TOPICAL_OINTMENT | RECTAL | Status: DC | PRN
  Administered 2014-06-06: 1 via RECTAL
  Filled 2014-06-06: qty 28

## 2014-06-06 MED ORDER — MEDROXYPROGESTERONE ACETATE 150 MG/ML IM SUSP: 150.0000 mg | INTRAMUSCULAR | Status: DC | PRN

## 2014-06-06 NOTE — Anesthesia Postprocedure Evaluation (Signed)
Anesthesia Post Note  Patient: Maria Vega  Procedure(s) Performed: * No procedures listed *  Anesthesia type: Epidural  Patient location: Mother/Baby  Post pain: Pain level controlled  Post assessment: Post-op Vital signs reviewed  Last Vitals:  Filed Vitals:   06/06/14 0620  BP: 124/76  Pulse: 70  Temp: 36.7 C  Resp: 18    Post vital signs: Reviewed  Level of consciousness:alert  Complications: No apparent anesthesia complications

## 2014-06-06 NOTE — Lactation Note (Signed)
This note was copied from the chart of Maria Alyza Artiaga. Lactation Consultation Note; Initial visit with mom. She reports baby just finished feeding for 20 min. Reports it feels like it is pinching some. Discussed lips being flanged at the breast and making sure bottom lip is untucked. Encouraged to call RN or LC for assist at next feeding. BF brochure given with resources for support after DC. Reviewed BFS[facility] and Op appointments as resources,Dad asking about milk storage- reviewed with parents and encouraged to use Baby and Me book as resource. Has Play[employer] pump in room- has not tried it yet, It is borrowed from a friend. Asking about insurance covering pump- encouraged to call them and find out details. No further questions at present.   Patient Name: Maria Vega [id number] Date: 06/06/2014 Reason for consult: Initial assessment   Maternal Data Formula Feeding for Exclusion: No Does the patient have breastfeeding experience prior to this delivery?: No  Feeding   LATCH Score/Interventions                      Lactation Tools Discussed/Used     Consult Status Consult Status: Follow-up Date: 06/06/14 Follow-up type: In-patient    Truddie Crumble 06/06/2014, 3:05 PM

## 2014-06-06 NOTE — Progress Notes (Signed)
Post Partum Day 1 Subjective: no complaints  Objective: Blood pressure 124/76, pulse 70, temperature 98.1 F (36.7 C), temperature source Oral, resp. rate 18, height 5\' 7"  (1.702 m), weight 209 lb (94.802 kg), last menstrual period 08/22/2013, SpO2 98 %, unknown if currently breastfeeding.  Physical Exam:  General: alert and cooperative Lochia: appropriate Uterine Fundus: firm Incision: n/a DVT Evaluation: No evidence of DVT seen on physical exam.   Recent Labs  06/04/14 2110 06/06/14 0550  HGB 11.7* 10.6*  HCT 34.6* 31.4*    Assessment/Plan: Plan for discharge tomorrow and Breastfeeding   LOS: 2 days   Maria Vega 06/06/2014, 8:37 AM

## 2014-06-07 ENCOUNTER — Ambulatory Visit: Payer: Self-pay

## 2014-06-07 MED ORDER — OXYCODONE-ACETAMINOPHEN 5-325 MG PO TABS: 1.0000 | ORAL_TABLET | ORAL | Status: DC | PRN

## 2014-06-07 NOTE — Lactation Note (Signed)
This note was copied from the chart of Maria Lurdes [place]. Lactation Consultation Note: Mother states that infant is feeding well. She denies having any concerns. Advised mother to continue to feed infant 8-12 times in 24 hours and will feeding cues. Reviewed baby and me book on treatment of severe engorgement. Mother receptive to all teaching. Mother is aware of available [place] services.  Patient Name: Maria Vega [id number] Date: 06/07/2014     Maternal Data    Feeding    LATCH Score/Interventions                      Lactation Tools Discussed/Used     Consult Status      Maria Vega 06/07/2014, 4:10 PM

## 2014-06-07 NOTE — Discharge Summary (Signed)
Obstetric Discharge Summary Reason for Admission: induction of labor Prenatal Procedures: ultrasound Intrapartum Procedures: spontaneous vaginal delivery Postpartum Procedures: none Complications-Operative and Postpartum: 2nd degree perineal laceration HEMOGLOBIN  Date Value Ref Range Status  06/06/2014 10.6* 12.0 - 15.0 g/dL Final   HCT  Date Value Ref Range Status  06/06/2014 31.4* 36.0 - 46.0 % Final    Physical Exam:  General: alert and cooperative Lochia: appropriate Uterine Fundus: firm Incision: n/a DVT Evaluation: No evidence of DVT seen on physical exam.  Discharge Diagnoses: Term Pregnancy-delivered  Discharge Information: Date: 06/07/2014 Activity: pelvic rest Diet: routine Medications: PNV and Percocet Condition: stable Instructions: refer to practice specific booklet Discharge to: home Follow-up Information    Schedule an appointment as soon as possible for a visit in 6 weeks to follow up.      Newborn Data: Live born female  Birth Weight: 6 lb 3.1 oz (2809 g) APGAR: 9, 9  Home with mother.  Maria Vega 06/07/2014, 9:03 AM

## 2014-07-21 ENCOUNTER — Other Ambulatory Visit: Payer: Self-pay | Admitting: Obstetrics and Gynecology

## 2014-07-22 LAB — CYTOLOGY - PAP

## 2016-07-25 ENCOUNTER — Encounter: Payer: Self-pay | Admitting: Family Medicine

## 2016-07-25 ENCOUNTER — Ambulatory Visit (INDEPENDENT_AMBULATORY_CARE_PROVIDER_SITE_OTHER): Payer: No Typology Code available for payment source | Admitting: Family Medicine

## 2016-07-25 VITALS — BP 114/80 | HR 60 | Resp 12 | Ht 67.0 in | Wt 192.1 lb

## 2016-07-25 DIAGNOSIS — B354 Tinea corporis: Secondary | ICD-10-CM

## 2016-07-25 DIAGNOSIS — K582 Mixed irritable bowel syndrome: Secondary | ICD-10-CM

## 2016-07-25 DIAGNOSIS — R109 Unspecified abdominal pain: Secondary | ICD-10-CM

## 2016-07-25 MED ORDER — KETOCONAZOLE 2 % EX CREA: 1.0000 "application " | TOPICAL_CREAM | Freq: Every day | CUTANEOUS | 0 refills | Status: AC

## 2016-07-25 NOTE — Progress Notes (Signed)
HPI:   Ms.Maria Vega is a 36 y.o. female, who is here today to establish care.  Former PCP: N/A Last preventive routine visit: Follows with gyn regularly.  Chronic medical problems: Depression   Concerns today:   Abdominal pain, periumbilical and lower abdomen,cramp,intermittently since 03/2016. Pain is usually 5-6/10, occasionally radiated to right low back. Sometimes she feels "little" nausea.  Episodes last for 2-3 days and a couple of weeks later she may have symptoms again.Sh has not identified exacerbating factors. Alleviated by defecation or by passing gas. Bowel movement are "never normal", Hx of constipation with hard stools,straining and occasional blood on tissue; which she attributes to hemorrhoids,no dyschezia. She also has episodes of loose stools about once every 2 weeks. She does not feel like she empties completely after defecation. Denies associated fever,chills,abnormal wt loss,urinary symptoms,or vaginal bleeding/discharge.  She has and IUD.  + Stress around the time abdominal pain started due to father illness.  Denies FHx of colon cancer.  In regard to alcohol she states that she drinks a few beers during weekends and gets "drunk"once per month. She denies alcohol abuse. + Smoker.  -Skin rash: She noted a couple years ago,it seems more noticeable during warm weather, mildly pruritic. No sick contact or travel around the time she noticed it. No new medication,body product, or detergent.  Rash is on upper back and shoulders,a few lesions noted recently on upper abdomen. She has not tried OTC medications. It seems to be stable.   Review of Systems  Constitutional: Positive for fatigue. Negative for activity change, appetite change, fever and unexpected weight change.  HENT: Negative for mouth sores, nosebleeds, sore throat and trouble swallowing.   Respiratory: Negative for cough, shortness of breath and wheezing.   Gastrointestinal:  Positive for abdominal pain, constipation, diarrhea and nausea. Negative for abdominal distention, blood in stool and vomiting.  Endocrine: Negative for cold intolerance, heat intolerance, polydipsia, polyphagia and polyuria.  Genitourinary: Negative for dysuria, frequency, hematuria, menstrual problem, vaginal bleeding and vaginal discharge.  Musculoskeletal: Negative for arthralgias, gait problem and joint swelling.  Skin: Positive for rash. Negative for pallor and wound.  Neurological: Negative for syncope, weakness and numbness.  Hematological: Negative for adenopathy. Does not bruise/bleed easily.  Psychiatric/Behavioral: Negative for confusion. The patient is nervous/anxious.     No current outpatient prescriptions on file prior to visit.   No current facility-administered medications on file prior to visit.      Past Medical History:  Diagnosis Date  . AMA (advanced maternal age) primigravida 89+   . Bilateral fibroadenomas of breasts   . Depression   . Herpes   . History of shingles   . Hx of varicella    No Known Allergies  Family History  Problem Relation Age of Onset  . Diabetes Mother   . Cancer Mother     breast  . Heart disease Father     6 vessel bypass, 2 aneurysms ( aorta and abd.)  . Cancer Father     oral  . Kidney disease Father     Social History   Social History  . Marital status: Married    Spouse name: N/A  . Number of children: N/A  . Years of education: N/A   Social History Main Topics  . Smoking status: Current Every Day Smoker    Packs/day: 0.50    Types: Cigarettes    Last attempt to quit: 10/26/2013  . Smokeless tobacco: Never Used  .  Alcohol use 2.4 oz/week    4 Cans of beer per week  . Drug use: No  . Sexual activity: Yes    Birth control/ protection: Pill   Other Topics Concern  . None   Social History Narrative  . None    Vitals:   07/25/16 1509 07/25/16 1627  BP: 114/80   Pulse: (!) 55 60  Resp: 12     Body mass  index is 30.09 kg/m.   Physical Exam  Nursing note and vitals reviewed. Constitutional: She is oriented to person, place, and time. She appears well-developed. No distress.  HENT:  Head: Atraumatic.  Mouth/Throat: Oropharynx is clear and moist and mucous membranes are normal.  Eyes: Conjunctivae and EOM are normal. Pupils are equal, round, and reactive to light.  Neck: No tracheal deviation present. No thyroid mass and no thyromegaly present.  Cardiovascular: Normal rate and regular rhythm.   No murmur heard. Pulses:      Dorsalis pedis pulses are 2+ on the right side, and 2+ on the left side.  Respiratory: Effort normal and breath sounds normal. No respiratory distress.  GI: Soft. She exhibits mass. There is no hepatomegaly. There is tenderness. There is no rebound and no guarding.    Musculoskeletal: She exhibits no edema.  Lymphadenopathy:    She has no cervical adenopathy.  Neurological: She is alert and oriented to person, place, and time. She has normal strength. Gait normal.  Skin: Skin is warm. Rash noted. Rash is macular. No erythema.     Finely scaly ,mildly hypopigmented lesions on upper back and posterior aspect of shoulders. 2 lesions on left upper abdomen,mild peripheral erythema,clear center and fine scaly. Similar lesions on left breast. No induration or tenderness.   Psychiatric: Her mood appears anxious.  Well groomed, good eye contact.      ASSESSMENT AND PLAN:   Maria Vega was seen today for establish care.  Diagnoses and all orders for this visit:  Abdominal pain, unspecified abdominal location  We discussed possible etiologies: constipation,Gyn,gallbladder among some. According to pt,she recently had a pelvic examination and normal otherwise. Symptoms suggest GI etiology,IBS. Because question of mass, which could be stool, I recommended abd/pelvic CT. Instructed about warning signs. Adverse effects of tobacco use. F/U in 4-5 weeks.  -      CBC -     TSH -     Comprehensive metabolic panel -     CT Abdomen Pelvis W Contrast; Future  Irritable bowel syndrome with both constipation and diarrhea  Try to avoid foods that can irritate GI system. Decrease alcohol intake. OTC Metamucil may help. Increase fluid intake.   Tinea corporis  Topical Ketoconazole recommended. OTC Selsun Blue shampoo daily for 5 min and rinse after. F/U as needed.  -     ketoconazole (NIZORAL) 2 % cream; Apply 1 application topically daily.       Anmol Paschen G. [place], MD  [facility]. [place] office.

## 2016-07-25 NOTE — Patient Instructions (Signed)
A few things to remember from today's visit:   Abdominal pain, unspecified abdominal location - Plan: CBC, TSH, Comprehensive metabolic panel, CT Abdomen Pelvis W Contrast  Irritable bowel syndrome with both constipation and diarrhea  Tinea corporis - Plan: ketoconazole (NIZORAL) 2 % cream  Metamucil daily.   We have ordered labs or studies at this visit.  It can take up to 1-2 weeks for results and processing. IF results require follow up or explanation, we will call you with instructions. Clinically stable results will be released to your [facility]. If you have not heard from [place] or cannot find your results in [facility] in 2 weeks please contact our office at [phone].  If you are not yet signed up for [facility], please consider signing up   Please be sure medication list is accurate. If a new problem present, please set up appointment sooner than planned today.

## 2016-07-26 LAB — COMPREHENSIVE METABOLIC PANEL
ALK PHOS: 83 U/L (ref 39–117)
ALT: 14 U/L (ref 0–35)
AST: 16 U/L (ref 0–37)
Albumin: 4.6 g/dL (ref 3.5–5.2)
BUN: 9 mg/dL (ref 6–23)
CALCIUM: 9.9 mg/dL (ref 8.4–10.5)
CO2: 27 meq/L (ref 19–32)
Chloride: 104 mEq/L (ref 96–112)
Creatinine, Ser: 0.86 mg/dL (ref 0.40–1.20)
GFR: 78.65 mL/min (ref >60.00)
Glucose, Bld: 75 mg/dL (ref 70–99)
Potassium: 4.2 mEq/L (ref 3.5–5.1)
Sodium: 139 mEq/L (ref 135–145)
Total Bilirubin: 0.5 mg/dL (ref 0.2–1.2)
Total Protein: 7.3 g/dL (ref 6.0–8.3)

## 2016-07-26 LAB — CBC
HEMATOCRIT: 44.7 % (ref 36.0–46.0)
Hemoglobin: 15 g/dL (ref 12.0–15.0)
MCHC: 33.5 g/dL (ref 30.0–36.0)
MCV: 93.7 fl (ref 78.0–100.0)
Platelets: 354 10*3/uL (ref 150.0–400.0)
RBC: 4.77 Mil/uL (ref 3.87–5.11)
RDW: 15 % (ref 11.5–15.5)
WBC: 9.5 10*3/uL (ref 4.0–10.5)

## 2016-07-26 LAB — TSH: TSH: 3.12 u[IU]/mL (ref 0.35–4.50)

## 2016-08-03 ENCOUNTER — Ambulatory Visit (INDEPENDENT_AMBULATORY_CARE_PROVIDER_SITE_OTHER)
Admission: RE | Admit: 2016-08-03 | Discharge: 2016-08-03 | Disposition: A | Discharge status: Home / Self Care | Payer: No Typology Code available for payment source | Source: Ambulatory Visit | Attending: Family Medicine | Admitting: Family Medicine

## 2016-08-03 DIAGNOSIS — R109 Unspecified abdominal pain: Secondary | ICD-10-CM

## 2016-08-03 MED ORDER — IOPAMIDOL (ISOVUE-300) INJECTION 61%
100.0000 mL | Freq: Once | INTRAVENOUS | Status: AC | PRN
  Administered 2016-08-03: 100 mL via INTRAVENOUS

## 2016-08-28 ENCOUNTER — Ambulatory Visit: Payer: No Typology Code available for payment source | Admitting: Family Medicine

## 2016-09-13 NOTE — Patient Instructions (Signed)
Your procedure is scheduled on:  Tuesday, September 26, 2016  Enter through the Main Entrance of [facility] at:  6:00 AM  Pick up the phone at the desk and dial [phone].  Call this number if you have problems the morning of surgery: [phone].  Remember: Do NOT eat food or drink after:  Midnight Monday  Take these medicines the morning of surgery with a SIP OF WATER:  None  Stop ALL herbal medications at this time  Do NOT smoke the day of surgery.  Do NOT wear jewelry (body piercing), metal hair clips/bobby pins, make-up, artifical eyelashes or nail polish. Do NOT wear lotions, powders, or perfumes.  You may wear deodorant. Do NOT shave for 48 hours prior to surgery. Do NOT bring valuables to the hospital. Contacts, dentures, or bridgework may not be worn into surgery.  Leave suitcase in car.  After surgery it may be brought to your room.  For patients admitted to the hospital, checkout time is 11:00 AM the day of discharge.  Bring a copy of your healthcare power of attorney and living will documents.

## 2016-09-14 ENCOUNTER — Encounter (HOSPITAL_COMMUNITY): Payer: Self-pay

## 2016-09-14 ENCOUNTER — Encounter (HOSPITAL_COMMUNITY)
Admission: RE | Admit: 2016-09-14 | Discharge: 2016-09-14 | Disposition: A | Discharge status: Home / Self Care | Payer: No Typology Code available for payment source | Source: Ambulatory Visit | Attending: Obstetrics and Gynecology | Admitting: Obstetrics and Gynecology

## 2016-09-14 DIAGNOSIS — Z01812 Encounter for preprocedural laboratory examination: Secondary | ICD-10-CM | POA: Diagnosis not present

## 2016-09-14 LAB — CBC
HCT: 44.4 % (ref 36.0–46.0)
Hemoglobin: 15.1 g/dL — ABNORMAL HIGH (ref 12.0–15.0)
MCH: 31.9 pg (ref 26.0–34.0)
MCHC: 34 g/dL (ref 30.0–36.0)
MCV: 93.9 fL (ref 78.0–100.0)
PLATELETS: 308 10*3/uL (ref 150–400)
RBC: 4.73 MIL/uL (ref 3.87–5.11)
RDW: 14.7 % (ref 11.5–15.5)
WBC: 8.4 10*3/uL (ref 4.0–10.5)

## 2016-09-26 ENCOUNTER — Encounter (HOSPITAL_COMMUNITY)
Admission: RE | Disposition: A | Discharge status: Home / Self Care | Payer: Self-pay | Source: Ambulatory Visit | Attending: Obstetrics and Gynecology

## 2016-09-26 ENCOUNTER — Inpatient Hospital Stay (HOSPITAL_COMMUNITY): Payer: No Typology Code available for payment source | Admitting: Anesthesiology

## 2016-09-26 ENCOUNTER — Inpatient Hospital Stay (HOSPITAL_COMMUNITY)
Admission: RE | Admit: 2016-09-26 | Discharge: 2016-09-27 | DRG: 743 | Disposition: A | Discharge status: Home / Self Care | Payer: No Typology Code available for payment source | Source: Ambulatory Visit | Attending: Obstetrics and Gynecology | Admitting: Obstetrics and Gynecology

## 2016-09-26 ENCOUNTER — Encounter (HOSPITAL_COMMUNITY): Payer: Self-pay | Admitting: *Deleted

## 2016-09-26 DIAGNOSIS — D271 Benign neoplasm of left ovary: Secondary | ICD-10-CM | POA: Diagnosis present

## 2016-09-26 DIAGNOSIS — D270 Benign neoplasm of right ovary: Secondary | ICD-10-CM | POA: Diagnosis present

## 2016-09-26 DIAGNOSIS — Z87891 Personal history of nicotine dependence: Secondary | ICD-10-CM

## 2016-09-26 DIAGNOSIS — R102 Pelvic and perineal pain: Secondary | ICD-10-CM | POA: Diagnosis present

## 2016-09-26 DIAGNOSIS — N83202 Unspecified ovarian cyst, left side: Secondary | ICD-10-CM | POA: Diagnosis present

## 2016-09-26 HISTORY — PX: LAPAROTOMY: SHX154

## 2016-09-26 HISTORY — PX: SALPINGOOPHORECTOMY: SHX82

## 2016-09-26 LAB — PREGNANCY, URINE: Preg Test, Ur: NEGATIVE

## 2016-09-26 LAB — TYPE AND SCREEN
ABO/RH(D): A POS
Antibody Screen: NEGATIVE

## 2016-09-26 SURGERY — LAPAROTOMY
Anesthesia: General | Site: Abdomen

## 2016-09-26 MED ORDER — HYDROMORPHONE HCL 1 MG/ML IJ SOLN
INTRAMUSCULAR | Status: DC | PRN
  Administered 2016-09-26 (×2): 0.5 mg via INTRAVENOUS

## 2016-09-26 MED ORDER — LACTATED RINGERS IV SOLN
INTRAVENOUS | Status: DC
  Administered 2016-09-26: 07:00:00 via INTRAVENOUS

## 2016-09-26 MED ORDER — FENTANYL CITRATE (PF) 250 MCG/5ML IJ SOLN
INTRAMUSCULAR | Status: AC
  Filled 2016-09-26: qty 5

## 2016-09-26 MED ORDER — DEXTROSE IN LACTATED RINGERS 5 % IV SOLN
INTRAVENOUS | Status: DC
  Administered 2016-09-26: 10:00:00 via INTRAVENOUS

## 2016-09-26 MED ORDER — LIDOCAINE HCL (CARDIAC) 20 MG/ML IV SOLN
INTRAVENOUS | Status: AC
  Filled 2016-09-26: qty 5

## 2016-09-26 MED ORDER — FENTANYL CITRATE (PF) 100 MCG/2ML IJ SOLN
INTRAMUSCULAR | Status: DC | PRN
  Administered 2016-09-26 (×2): 50 ug via INTRAVENOUS
  Administered 2016-09-26: 25 ug via INTRAVENOUS
  Administered 2016-09-26: 50 ug via INTRAVENOUS
  Administered 2016-09-26 (×2): 25 ug via INTRAVENOUS
  Administered 2016-09-26: 50 ug via INTRAVENOUS
  Administered 2016-09-26: 25 ug via INTRAVENOUS

## 2016-09-26 MED ORDER — ROCURONIUM BROMIDE 100 MG/10ML IV SOLN
INTRAVENOUS | Status: DC | PRN
  Administered 2016-09-26: 40 mg via INTRAVENOUS

## 2016-09-26 MED ORDER — LIDOCAINE HCL (CARDIAC) 20 MG/ML IV SOLN
INTRAVENOUS | Status: DC | PRN
  Administered 2016-09-26: 100 mg via INTRAVENOUS

## 2016-09-26 MED ORDER — SCOPOLAMINE 1 MG/3DAYS TD PT72
1.0000 | MEDICATED_PATCH | Freq: Once | TRANSDERMAL | Status: DC
  Administered 2016-09-26: 1.5 mg via TRANSDERMAL

## 2016-09-26 MED ORDER — DEXAMETHASONE SODIUM PHOSPHATE 10 MG/ML IJ SOLN
INTRAMUSCULAR | Status: AC
  Filled 2016-09-26: qty 1

## 2016-09-26 MED ORDER — SUGAMMADEX SODIUM 200 MG/2ML IV SOLN
INTRAVENOUS | Status: AC
  Filled 2016-09-26: qty 2

## 2016-09-26 MED ORDER — CEFOTETAN DISODIUM-DEXTROSE 2-2.08 GM-% IV SOLR
2.0000 g | INTRAVENOUS | Status: AC
  Administered 2016-09-26: 2 g via INTRAVENOUS

## 2016-09-26 MED ORDER — ONDANSETRON HCL 4 MG/2ML IJ SOLN: 4.0000 mg | Freq: Four times a day (QID) | INTRAMUSCULAR | Status: DC | PRN

## 2016-09-26 MED ORDER — SCOPOLAMINE 1 MG/3DAYS TD PT72
MEDICATED_PATCH | TRANSDERMAL | Status: AC
  Administered 2016-09-26: 1.5 mg via TRANSDERMAL
  Filled 2016-09-26: qty 1

## 2016-09-26 MED ORDER — KETOROLAC TROMETHAMINE 30 MG/ML IJ SOLN
30.0000 mg | Freq: Three times a day (TID) | INTRAMUSCULAR | Status: AC
  Administered 2016-09-26 – 2016-09-27 (×3): 30 mg via INTRAVENOUS
  Filled 2016-09-26 (×3): qty 1

## 2016-09-26 MED ORDER — OXYCODONE HCL 5 MG/5ML PO SOLN: 5.0000 mg | Freq: Once | ORAL | Status: DC | PRN

## 2016-09-26 MED ORDER — GLYCOPYRROLATE 0.2 MG/ML IJ SOLN
INTRAMUSCULAR | Status: AC
  Filled 2016-09-26: qty 1

## 2016-09-26 MED ORDER — HYDROMORPHONE HCL 1 MG/ML IJ SOLN
INTRAMUSCULAR | Status: AC
  Filled 2016-09-26: qty 1

## 2016-09-26 MED ORDER — MIDAZOLAM HCL 2 MG/2ML IJ SOLN
INTRAMUSCULAR | Status: AC
  Filled 2016-09-26: qty 2

## 2016-09-26 MED ORDER — MIDAZOLAM HCL 2 MG/2ML IJ SOLN
INTRAMUSCULAR | Status: DC | PRN
  Administered 2016-09-26: 2 mg via INTRAVENOUS

## 2016-09-26 MED ORDER — ONDANSETRON HCL 4 MG/2ML IJ SOLN
INTRAMUSCULAR | Status: DC | PRN
  Administered 2016-09-26: 4 mg via INTRAVENOUS

## 2016-09-26 MED ORDER — SUGAMMADEX SODIUM 200 MG/2ML IV SOLN
INTRAVENOUS | Status: DC | PRN
  Administered 2016-09-26: 200 mg via INTRAVENOUS

## 2016-09-26 MED ORDER — DEXAMETHASONE SODIUM PHOSPHATE 10 MG/ML IJ SOLN
INTRAMUSCULAR | Status: DC | PRN
  Administered 2016-09-26: 10 mg via INTRAVENOUS

## 2016-09-26 MED ORDER — CEFOTETAN DISODIUM-DEXTROSE 2-2.08 GM-% IV SOLR
INTRAVENOUS | Status: AC
  Filled 2016-09-26: qty 50

## 2016-09-26 MED ORDER — GLYCOPYRROLATE 0.2 MG/ML IJ SOLN
INTRAMUSCULAR | Status: DC | PRN
  Administered 2016-09-26 (×2): 0.1 mg via INTRAVENOUS

## 2016-09-26 MED ORDER — MENTHOL 3 MG MT LOZG: 1.0000 | LOZENGE | OROMUCOSAL | Status: DC | PRN

## 2016-09-26 MED ORDER — OXYCODONE-ACETAMINOPHEN 5-325 MG PO TABS
1.0000 | ORAL_TABLET | ORAL | Status: DC | PRN
  Administered 2016-09-26 (×2): 1 via ORAL
  Filled 2016-09-26 (×2): qty 1

## 2016-09-26 MED ORDER — OXYCODONE HCL 5 MG PO TABS: 5.0000 mg | ORAL_TABLET | Freq: Once | ORAL | Status: DC | PRN

## 2016-09-26 MED ORDER — HEPARIN SODIUM (PORCINE) 5000 UNIT/ML IJ SOLN
INTRAMUSCULAR | Status: AC
  Filled 2016-09-26: qty 1

## 2016-09-26 MED ORDER — FENTANYL CITRATE (PF) 100 MCG/2ML IJ SOLN
INTRAMUSCULAR | Status: AC
  Filled 2016-09-26: qty 2

## 2016-09-26 MED ORDER — ONDANSETRON HCL 4 MG PO TABS: 4.0000 mg | ORAL_TABLET | Freq: Four times a day (QID) | ORAL | Status: DC | PRN

## 2016-09-26 MED ORDER — LACTATED RINGERS IV SOLN: INTRAVENOUS | Status: DC

## 2016-09-26 MED ORDER — ONDANSETRON HCL 4 MG/2ML IJ SOLN
INTRAMUSCULAR | Status: AC
  Filled 2016-09-26: qty 2

## 2016-09-26 MED ORDER — KETOROLAC TROMETHAMINE 30 MG/ML IJ SOLN
INTRAMUSCULAR | Status: DC | PRN
  Administered 2016-09-26: 30 mg via INTRAVENOUS

## 2016-09-26 MED ORDER — FENTANYL CITRATE (PF) 100 MCG/2ML IJ SOLN
25.0000 ug | INTRAMUSCULAR | Status: DC | PRN
  Administered 2016-09-26 (×2): 50 ug via INTRAVENOUS

## 2016-09-26 MED ORDER — HYDROMORPHONE HCL 1 MG/ML IJ SOLN
1.0000 mg | INTRAMUSCULAR | Status: DC | PRN
  Administered 2016-09-26: 1 mg via INTRAVENOUS
  Filled 2016-09-26: qty 1

## 2016-09-26 MED ORDER — FENTANYL CITRATE (PF) 100 MCG/2ML IJ SOLN
INTRAMUSCULAR | Status: AC
  Administered 2016-09-26: 50 ug via INTRAVENOUS
  Filled 2016-09-26: qty 2

## 2016-09-26 MED ORDER — PROPOFOL 10 MG/ML IV BOLUS
INTRAVENOUS | Status: AC
  Filled 2016-09-26: qty 40

## 2016-09-26 MED ORDER — ROCURONIUM BROMIDE 100 MG/10ML IV SOLN
INTRAVENOUS | Status: AC
  Filled 2016-09-26: qty 1

## 2016-09-26 MED ORDER — KETOROLAC TROMETHAMINE 30 MG/ML IJ SOLN
INTRAMUSCULAR | Status: AC
Start: 2016-09-26 — End: 2016-09-26
  Filled 2016-09-26: qty 1

## 2016-09-26 MED ORDER — PROPOFOL 10 MG/ML IV BOLUS
INTRAVENOUS | Status: DC | PRN
  Administered 2016-09-26: 20 mg via INTRAVENOUS
  Administered 2016-09-26: 180 mg via INTRAVENOUS

## 2016-09-26 SURGICAL SUPPLY — 27 items
CANISTER SUCT 3000ML PPV (MISCELLANEOUS) ×4 IMPLANT
CLOTH BEACON ORANGE TIMEOUT ST (SAFETY) ×4 IMPLANT
CONT PATH 16OZ SNAP LID 3702 (MISCELLANEOUS) ×4 IMPLANT
DRAPE WARM FLUID 44X44 (DRAPE) ×2 IMPLANT
DRSG OPSITE POSTOP 4X10 (GAUZE/BANDAGES/DRESSINGS) ×2 IMPLANT
DURAPREP 26ML APPLICATOR (WOUND CARE) ×4 IMPLANT
GAUZE SPONGE 4X4 16PLY XRAY LF (GAUZE/BANDAGES/DRESSINGS) IMPLANT
GLOVE BIO SURGEON STRL SZ 6.5 (GLOVE) ×3 IMPLANT
GLOVE BIO SURGEONS STRL SZ 6.5 (GLOVE) ×1
GLOVE BIOGEL PI IND STRL 7.0 (GLOVE) ×4 IMPLANT
GLOVE BIOGEL PI INDICATOR 7.0 (GLOVE) ×4
GOWN STRL REUS W/TWL LRG LVL3 (GOWN DISPOSABLE) ×12 IMPLANT
HEMOSTAT SURGICEL 4X8 (HEMOSTASIS) IMPLANT
NS IRRIG 1000ML POUR BTL (IV SOLUTION) ×4 IMPLANT
PACK ABDOMINAL GYN (CUSTOM PROCEDURE TRAY) ×4 IMPLANT
PAD OB MATERNITY 4.3X12.25 (PERSONAL CARE ITEMS) ×4 IMPLANT
PROTECTOR NERVE ULNAR (MISCELLANEOUS) ×8 IMPLANT
SPONGE LAP 18X18 X RAY DECT (DISPOSABLE) ×8 IMPLANT
SUT MON AB-0 CT1 36 (SUTURE) ×4 IMPLANT
SUT PDS AB 0 CTX 60 (SUTURE) ×8 IMPLANT
SUT PLAIN 2 0 XLH (SUTURE) IMPLANT
SUT VIC AB 0 CT1 27 (SUTURE) ×4
SUT VIC AB 0 CT1 27XBRD ANBCTR (SUTURE) ×2 IMPLANT
SUT VIC AB 4-0 KS 27 (SUTURE) ×4 IMPLANT
SUT VICRYL 0 TIES 12 18 (SUTURE) IMPLANT
TOWEL OR 17X24 6PK STRL BLUE (TOWEL DISPOSABLE) ×8 IMPLANT
TRAY FOLEY CATH SILVER 14FR (SET/KITS/TRAYS/PACK) ×4 IMPLANT

## 2016-09-26 NOTE — Progress Notes (Signed)
Day of Surgery Procedure(s) (LRB): LAPAROTOMY WITH RIGHT OVARIAN LUMPECTOMY, LEFT SALPINGOOPHORECTOMY WITH LEFT OVARIAN CYSTECTOMY AND PELVIC WASHINGS (N/A) SALPINGO OOPHORECTOMY (Left)  Subjective: Patient reports incisional pain and tolerating PO.  Was able to tolerate PO pain meds but only took 1 percocet at a time.  No CP or SOB.    Objective: I have reviewed patient's vital signs, intake and output and medications.  General: alert and cooperative GI: normal findings: soft, non-tender Extremities: extremities normal, atraumatic, no cyanosis or edema  Assessment: s/p Procedure(s) with comments: LAPAROTOMY WITH RIGHT OVARIAN LUMPECTOMY, LEFT SALPINGOOPHORECTOMY WITH LEFT OVARIAN CYSTECTOMY AND PELVIC WASHINGS (N/A) - left SALPINGO OOPHORECTOMY SALPINGO OOPHORECTOMY (Left): stable  Plan: Advance diet Encourage ambulation Advance to PO medication - discussed she can take 2 percocet Q4-6hrs prn.  IV dilaudid ordered for breakthrough  LOS: 0 days    Gale Klar 09/26/2016, 5:22 PM

## 2016-09-26 NOTE — Op Note (Signed)
NAMEMARKELL, SCHRIER            ACCOUNT NO.:  [id number]  MEDICAL RECORD NO.:  [id number]  LOCATION:                                 FACILITY:  PHYSICIAN:  Marylynn Pearson, MD         DATE OF BIRTH:  DATE OF PROCEDURE:  09/26/2016 DATE OF DISCHARGE:                              OPERATIVE REPORT   PREOPERATIVE DIAGNOSES:  Pelvic pain and left adnexal mass.  POSTOPERATIVE DIAGNOSES: 1. Left adnexal mass. 2. Right ovarian tumor.  PROCEDURE: 1. Laparotomy with left salpingo-oophorectomy. 2. Excision of right ovarian tumor (suspect fibroma). 3. Pelvic washings.  SURGEON:  Marylynn Pearson, MD.  ASSISTANT:  Arvella Nigh.  ANESTHESIA:  General.  SPECIMENS: 1. Pelvic washing. 2. Left fallopian tube and ovary. 3. Right ovarian tumor.  COMPLICATIONS:  None.  CONDITION:  Stable to recovery room.  PROCEDURE:  The patient was taken to the operating room after informed consent was obtained.  She was given general anesthesia in the supine position.  She was prepped and draped sterilely and a Foley catheter was inserted.  A Pfannenstiel skin incision was made with a scalpel and extended laterally.  The fascia was incised and extended using curved Mayo scissors.  Peritoneum was entered sharply and extended bluntly. The left adnexal mass was palpated and delivered through the incision intact.  Pedicle was doubly clamped just adjacent to the fallopian tube, and the left tube and ovary were excised.  Pedicle was doubly tied using Vicryl.  Hemostasis was noted.  Prior to excision of the left adnexal mass, pelvic washings were collected.  After excision of the left adnexal mass, our attention was turned to the uterus and right adnexa. Uterus appeared normal.  Right fallopian tube appeared normal.  Right ovary had a small, approximately 1.5 cm firm nodule.  This was excised using the Bovie as there was a clear plane between the nodule and the right ovary.  Hemostasis was noted.  The  pelvis was irrigated and hemostasis was noted throughout and at each pedicle.  The peritoneum was closed with Monocryl, fascia was closed with PDS, and the skin was closed with Vicryl.  Skin glue was placed.  The patient was extubated and taken to the recovery room in stable condition.     Marylynn Pearson, MD     GA/MEDQ  D:  09/26/2016  T:  09/26/2016  Job:  [id number]

## 2016-09-26 NOTE — Anesthesia Preprocedure Evaluation (Signed)
Anesthesia Evaluation  Patient identified by MRN, date of birth, ID band Patient awake    Reviewed: Allergy & Precautions, H&P , NPO status , Patient's Chart, lab work & pertinent test results  Airway Mallampati: II   Neck ROM: full    Dental   Pulmonary former smoker,    breath sounds clear to auscultation       Cardiovascular negative cardio ROS   Rhythm:regular Rate:Normal     Neuro/Psych PSYCHIATRIC DISORDERS Depression    GI/Hepatic   Endo/Other    Renal/GU      Musculoskeletal   Abdominal   Peds  Hematology   Anesthesia Other Findings   Reproductive/Obstetrics                             Anesthesia Physical Anesthesia Plan  ASA: II  Anesthesia Plan: General   Post-op Pain Management:    Induction: Intravenous  PONV Risk Score and Plan: 4 or greater and Ondansetron, Dexamethasone, Propofol, Midazolam, Scopolamine patch - Pre-op and Treatment may vary due to age or medical condition  Airway Management Planned: Oral ETT  Additional Equipment:   Intra-op Plan:   Post-operative Plan: Extubation in OR  Informed Consent: I have reviewed the patients History and Physical, chart, labs and discussed the procedure including the risks, benefits and alternatives for the proposed anesthesia with the patient or authorized representative who has indicated his/her understanding and acceptance.     Plan Discussed with: CRNA, Anesthesiologist and Surgeon  Anesthesia Plan Comments:         Anesthesia Quick Evaluation

## 2016-09-26 NOTE — Transfer of Care (Signed)
Immediate Anesthesia Transfer of Care Note  Patient: Maria Vega  Procedure(s) Performed: Procedure(s) with comments: LAPAROTOMY WITH RIGHT OVARIAN LUMPECTOMY, LEFT SALPINGOOPHORECTOMY WITH LEFT OVARIAN CYSTECTOMY AND PELVIC WASHINGS (N/A) - left SALPINGO OOPHORECTOMY SALPINGO OOPHORECTOMY (Left)  Patient Location: PACU  Anesthesia Type:General  Level of Consciousness: awake, alert  and patient cooperative  Airway & Oxygen Therapy: Patient Spontanous Breathing and Patient connected to nasal cannula oxygen  Post-op Assessment: Report given to RN and Post -op Vital signs reviewed and stable  Post vital signs: Reviewed and stable  Last Vitals:  Vitals:   09/26/16 0628  BP: 125/85  Pulse: 70  Resp: 16  Temp: 36.8 C    Last Pain:  Vitals:   09/26/16 0628  TempSrc: Oral      Patients Stated Pain Goal: 5 (25/63/89 3734)  Complications: No apparent anesthesia complications

## 2016-09-26 NOTE — H&P (Signed)
Faustine Tates is an 36 y.o. female with pelvic pain and pressure presents for surgical mngt of left adnexal cyst.  Pt denies fevers/chills.  Mirena IUD for contraception    Menstrual History: Patient's last menstrual period was 09/11/2016 (exact date).    Past Medical History:  Diagnosis Date  . AMA (advanced maternal age) primigravida 12+   . Bilateral fibroadenomas of breasts   . Depression   . Herpes   . History of shingles   . Hx of varicella     Past Surgical History:  Procedure Laterality Date  . BREAST SURGERY  2014   biopsy  . DILATION AND CURETTAGE OF UTERUS  2000  . FINGER SURGERY Right 1986   R index finger -tip amputed in door. Sewn up.  . WISDOM TOOTH EXTRACTION  2002    Family History  Problem Relation Age of Onset  . Diabetes Mother   . Cancer Mother        breast  . Heart disease Father        6 vessel bypass, 2 aneurysms ( aorta and abd.)  . Cancer Father        oral  . Kidney disease Father     Social History:  reports that she has quit smoking. Her smoking use included Cigarettes. She has a 20.00 pack-year smoking history. She has never used smokeless tobacco. She reports that she drinks about 2.4 oz of alcohol per week . She reports that she does not use drugs.  Allergies: No Known Allergies  Prescriptions Prior to Admission  Medication Sig Dispense Refill Last Dose  . ibuprofen (ADVIL,MOTRIN) 200 MG tablet Take 400 mg by mouth 2 (two) times daily as needed for mild pain.   09/25/2016 at Unknown time  . levonorgestrel (MIRENA, 52 MG,) 20 MCG/24HR IUD 1 each by Intrauterine route once.     . valACYclovir (VALTREX) 500 MG tablet Take 500 mg by mouth daily as needed (outbreaks).   More than a month at Unknown time    ROS  Blood pressure 125/85, pulse 70, temperature 98.2 F (36.8 C), temperature source Oral, resp. rate 16, last menstrual period 09/11/2016, SpO2 100 %, unknown if currently breastfeeding. Physical Exam  Gen - NAD Abd -  soft, tender bilat LQ no r/g CV - RRR Lungs - clear Ext - NT, no edema  Results for orders placed or performed during the hospital encounter of 09/26/16 (from the past 24 hour(s))  Pregnancy, urine     Status: None   Collection Time: 09/26/16  6:00 AM  Result Value Ref Range   Preg Test, Ur NEGATIVE NEGATIVE    [place]:  Complex left adnexal mass with appearance of mucinous cystadenoma on pelvic US.  Normal right adnexa CA 125 = 7  Assessment/Plan:  Left adnexal cyst Laparotomy with left salpingoophorectomy R/b/a discussed, questions answered, informed consent  Jonty Morrical 09/26/2016, 7:08 AM

## 2016-09-26 NOTE — Anesthesia Procedure Notes (Signed)
Procedure Name: Intubation Date/Time: 09/26/2016 7:33 AM Performed by: Georgeanne Nim Pre-anesthesia Checklist: Patient identified, Timeout performed, Emergency Drugs available, Suction available and Patient being monitored Patient Re-evaluated:Patient Re-evaluated prior to inductionOxygen Delivery Method: Circle system utilized Preoxygenation: Pre-oxygenation with 100% oxygen Intubation Type: IV induction Ventilation: Mask ventilation without difficulty Laryngoscope Size: Mac and 3 Grade View: Grade I Tube type: Oral Tube size: 7.0 mm Number of attempts: 1 Airway Equipment and Method: Stylet Placement Confirmation: ETT inserted through vocal cords under direct vision,  positive ETCO2,  breath sounds checked- equal and bilateral and CO2 detector Secured at: 21 cm Tube secured with: Tape Dental Injury: Teeth and Oropharynx as per pre-operative assessment

## 2016-09-27 ENCOUNTER — Encounter (HOSPITAL_COMMUNITY): Payer: Self-pay | Admitting: Obstetrics and Gynecology

## 2016-09-27 LAB — CBC
HEMATOCRIT: 39.5 % (ref 36.0–46.0)
HEMOGLOBIN: 13.4 g/dL (ref 12.0–15.0)
MCH: 31.6 pg (ref 26.0–34.0)
MCHC: 33.9 g/dL (ref 30.0–36.0)
MCV: 93.2 fL (ref 78.0–100.0)
Platelets: 251 10*3/uL (ref 150–400)
RBC: 4.24 MIL/uL (ref 3.87–5.11)
RDW: 14.5 % (ref 11.5–15.5)
WBC: 12.2 10*3/uL — AB (ref 4.0–10.5)

## 2016-09-27 MED ORDER — IBUPROFEN 600 MG PO TABS: 600.0000 mg | ORAL_TABLET | Freq: Four times a day (QID) | ORAL | 1 refills | Status: DC | PRN

## 2016-09-27 MED ORDER — OXYCODONE-ACETAMINOPHEN 5-325 MG PO TABS: 1.0000 | ORAL_TABLET | ORAL | 0 refills | Status: DC | PRN

## 2016-09-27 NOTE — Discharge Instructions (Signed)
Exploratory Laparotomy, Adult, Care After °Refer to this sheet in the next few weeks. These instructions provide you with information about caring for yourself after your procedure. Your health care provider may also give you more specific instructions. Your treatment has been planned according to current medical practices, but problems sometimes occur. Call your health care provider if you have any problems or questions after your procedure. °What can I expect after the procedure? °After your procedure, it is typical to have: °· Abdominal soreness. °· Fatigue. °· A sore throat from tubes in your throat. °· A lack of appetite. ° °Follow these instructions at home: °Medicines °· Take medicines only as directed by your health care provider. °· Do not drive or operate heavy machinery while taking pain medicine. °Incision care °· There are many different ways to close and cover an incision, including stitches (sutures), skin glue, and adhesive strips. Follow your health care provider's instructions about: °? Incision care. °? Bandage (dressing) changes and removal. °? Incision closure removal. °· Do not take showers or baths until your health care provider says that you can. °· Check your incision area daily for signs of infection. Watch for: °? Redness. °? Tenderness. °? Swelling. °? Drainage. °Activity °· Do not lift anything that is heavier than 10 pounds (4.5 kg) until your health care provider says that it is safe. °· Try to walk a little bit each day if your health care provider says that it is okay. °· Ask your health care provider when you can start to do your usual activities again, such as driving, going back to work, and having sex. °Eating and drinking °· You may eat what you usually eat. Include lots of whole grains, fruits, and vegetables in your diet. This will help to prevent constipation. °· Drink enough fluid to keep your urine clear or pale yellow. °General instructions °· Keep all follow-up visits as  directed by your health care provider. This is important. °Contact a health care provider if: °· You have a fever. °· You have chills. °· Your pain medicine is not helping. °· You have constipation or diarrhea. °· You have nausea or vomiting. °· You have drainage, redness, swelling, or pain at your incision site. °Get help right away if: °· Your pain is getting worse. °· It has been more than 3 days since you been able to have a bowel movement. °· You have ongoing (persistent) vomiting. °· The edges of your incision open up. °· You have warmth, tenderness, and swelling in your calf. °· You have trouble breathing. °· You have chest pain. °This information is not intended to replace advice given to you by your health care provider. Make sure you discuss any questions you have with your health care provider. °Document Released: 10/19/2003 Document Revised: 08/12/2015 Document Reviewed: 10/22/2013 °Elsevier Interactive Patient Education © 2018 [employer] ° °

## 2016-09-27 NOTE — Discharge Summary (Signed)
Physician Discharge Summary  Patient ID: Maria Vega MRN: [id number] DOB/AGE: [birth year] 38 y.o.  Admit date: 09/26/2016 Discharge date: 09/27/2016  Admission Diagnoses:  Left adnexal cyst  Discharge Diagnoses:  Active Problems:   Left ovarian cyst right ovarian nodule  Discharged Condition: stable  Hospital Course: Pt was admitted for post op care.  Initially, pain was controlled with IV pain medication.  As she was able to tolerate PO intake, she was given PO pain medications.  POD1, vitals and labs are normal. Pt is feeling good.  Ambulating and tolerating a regular diet.  Voiding without problems after foley removed.    Consults: None  Significant Diagnostic Studies: labs: cbc  Treatments: surgery: laparotomy with LSO and excision of right ovarian nodule  Discharge Exam: Blood pressure 132/68, pulse (!) 44, temperature 98.2 F (36.8 C), temperature source Oral, resp. rate 16, height 5\' 7"  (1.702 m), weight 194 lb (88 kg), last menstrual period 09/11/2016, SpO2 97 %, unknown if currently breastfeeding. General appearance: alert and cooperative GI: normal findings: soft, non-tender Extremities: extremities normal, atraumatic, no cyanosis or edema Bandage:  Clean and intact Disposition: 01-Home or Self Care  Discharge Instructions    Call MD for:  difficulty breathing, headache or visual disturbances    Complete by:  As directed    Call MD for:  persistant dizziness or light-headedness    Complete by:  As directed    Call MD for:  persistant nausea and vomiting    Complete by:  As directed    Call MD for:  redness, tenderness, or signs of infection (pain, swelling, redness, odor or green/yellow discharge around incision site)    Complete by:  As directed    Call MD for:  severe uncontrolled pain    Complete by:  As directed    Call MD for:  temperature >100.4    Complete by:  As directed    Diet - low sodium heart healthy    Complete by:  As directed    Increase activity slowly    Complete by:  As directed      Allergies as of 09/27/2016   No Known Allergies     Medication List    TAKE these medications   ibuprofen 600 MG tablet Commonly known as:  ADVIL Take 1 tablet (600 mg total) by mouth every 6 (six) hours as needed. What changed:  medication strength  how much to take  when to take this  reasons to take this   MIRENA (52 MG) 20 MCG/24HR IUD Generic drug:  levonorgestrel 1 each by Intrauterine route once.   oxyCODONE-acetaminophen 5-325 MG tablet Commonly known as:  PERCOCET/ROXICET Take 1-2 tablets by mouth every 4 (four) hours as needed for severe pain (moderate to severe pain (when tolerating fluids)).   valACYclovir 500 MG tablet Commonly known as:  VALTREX Take 500 mg by mouth daily as needed (outbreaks).      Follow-up Information    Marylynn Pearson, MD. Schedule an appointment as soon as possible for a visit in 2 week(s).   Specialty:  Obstetrics and Gynecology Contact information: [place], [place] [address] [phone]           Signed: Robson Trickey 09/27/2016, 8:14 AM

## 2016-09-27 NOTE — Progress Notes (Signed)
Pt given discharge instructions and prescription, questions answered, states understanding, signes and given copy.

## 2016-09-29 NOTE — Anesthesia Postprocedure Evaluation (Addendum)
Anesthesia Post Note  Patient: Aanshi Batchelder  Procedure(s) Performed: Procedure(s) (LRB): LAPAROTOMY WITH RIGHT OVARIAN LUMPECTOMY, LEFT SALPINGOOPHORECTOMY WITH LEFT OVARIAN CYSTECTOMY AND PELVIC WASHINGS (N/A) SALPINGO OOPHORECTOMY (Left)     Patient location during evaluation: PACU Anesthesia Type: General Level of consciousness: awake and alert and patient cooperative Pain management: pain level controlled Vital Signs Assessment: post-procedure vital signs reviewed and stable Respiratory status: spontaneous breathing and respiratory function stable Cardiovascular status: stable Anesthetic complications: no    Last Vitals:  Vitals:   09/27/16 0403 09/27/16 0819  BP: 132/68 124/68  Pulse: (!) 44 (!) 43  Resp: 16 16  Temp: 36.8 C 36.6 C    Last Pain:  Vitals:   09/27/16 0910  TempSrc:   PainSc: [place]

## 2016-12-07 ENCOUNTER — Encounter: Payer: Self-pay | Admitting: Family Medicine

## 2017-04-23 DIAGNOSIS — R102 Pelvic and perineal pain: Secondary | ICD-10-CM | POA: Diagnosis not present

## 2017-04-23 DIAGNOSIS — R1032 Left lower quadrant pain: Secondary | ICD-10-CM | POA: Diagnosis not present

## 2017-04-23 DIAGNOSIS — Z803 Family history of malignant neoplasm of breast: Secondary | ICD-10-CM | POA: Diagnosis not present

## 2017-04-23 DIAGNOSIS — Z808 Family history of malignant neoplasm of other organs or systems: Secondary | ICD-10-CM | POA: Diagnosis not present

## 2017-06-15 DIAGNOSIS — J019 Acute sinusitis, unspecified: Secondary | ICD-10-CM | POA: Diagnosis not present

## 2017-06-15 DIAGNOSIS — J029 Acute pharyngitis, unspecified: Secondary | ICD-10-CM | POA: Diagnosis not present

## 2017-06-29 DIAGNOSIS — T63484A Toxic effect of venom of other arthropod, undetermined, initial encounter: Secondary | ICD-10-CM | POA: Diagnosis not present

## 2017-08-23 ENCOUNTER — Other Ambulatory Visit: Payer: Self-pay | Admitting: Obstetrics and Gynecology

## 2017-08-23 DIAGNOSIS — N926 Irregular menstruation, unspecified: Secondary | ICD-10-CM | POA: Diagnosis not present

## 2017-08-23 DIAGNOSIS — N6310 Unspecified lump in the right breast, unspecified quadrant: Secondary | ICD-10-CM | POA: Diagnosis not present

## 2017-08-28 ENCOUNTER — Ambulatory Visit
Admission: RE | Admit: 2017-08-28 | Discharge: 2017-08-28 | Disposition: A | Discharge status: Home / Self Care | Payer: 59 | Source: Ambulatory Visit | Attending: Obstetrics and Gynecology | Admitting: Obstetrics and Gynecology

## 2017-08-28 ENCOUNTER — Ambulatory Visit
Admission: RE | Admit: 2017-08-28 | Discharge: 2017-08-28 | Disposition: A | Discharge status: Home / Self Care | Payer: No Typology Code available for payment source | Source: Ambulatory Visit | Attending: Obstetrics and Gynecology | Admitting: Obstetrics and Gynecology

## 2017-08-28 DIAGNOSIS — N6310 Unspecified lump in the right breast, unspecified quadrant: Secondary | ICD-10-CM | POA: Diagnosis not present

## 2017-08-28 DIAGNOSIS — R922 Inconclusive mammogram: Secondary | ICD-10-CM | POA: Diagnosis not present

## 2017-08-28 DIAGNOSIS — Z853 Personal history of malignant neoplasm of breast: Secondary | ICD-10-CM | POA: Diagnosis not present

## 2017-08-30 ENCOUNTER — Other Ambulatory Visit: Payer: Self-pay | Admitting: Obstetrics and Gynecology

## 2017-08-30 DIAGNOSIS — Z803 Family history of malignant neoplasm of breast: Secondary | ICD-10-CM

## 2018-01-02 ENCOUNTER — Encounter: Payer: Self-pay | Admitting: Family Medicine

## 2018-01-02 ENCOUNTER — Ambulatory Visit (INDEPENDENT_AMBULATORY_CARE_PROVIDER_SITE_OTHER): Payer: 59 | Admitting: Family Medicine

## 2018-01-02 VITALS — BP 124/82 | HR 96 | Temp 98.5°F | Resp 12 | Ht 67.0 in | Wt 196.5 lb

## 2018-01-02 DIAGNOSIS — F17200 Nicotine dependence, unspecified, uncomplicated: Secondary | ICD-10-CM

## 2018-01-02 DIAGNOSIS — Z131 Encounter for screening for diabetes mellitus: Secondary | ICD-10-CM

## 2018-01-02 DIAGNOSIS — E782 Mixed hyperlipidemia: Secondary | ICD-10-CM | POA: Diagnosis not present

## 2018-01-02 DIAGNOSIS — Z23 Encounter for immunization: Secondary | ICD-10-CM

## 2018-01-02 DIAGNOSIS — H9201 Otalgia, right ear: Secondary | ICD-10-CM | POA: Diagnosis not present

## 2018-01-02 DIAGNOSIS — Z0000 Encounter for general adult medical examination without abnormal findings: Secondary | ICD-10-CM | POA: Diagnosis not present

## 2018-01-02 LAB — BASIC METABOLIC PANEL
BUN: 11 mg/dL (ref 6–23)
CHLORIDE: 106 meq/L (ref 96–112)
CO2: 26 mEq/L (ref 19–32)
Calcium: 9.4 mg/dL (ref 8.4–10.5)
Creatinine, Ser: 0.84 mg/dL (ref 0.40–1.20)
GFR: 80.2 mL/min (ref >60.00)
Glucose, Bld: 98 mg/dL (ref 70–99)
POTASSIUM: 4.4 meq/L (ref 3.5–5.1)
Sodium: 140 mEq/L (ref 135–145)

## 2018-01-02 LAB — LIPID PANEL
CHOLESTEROL: 250 mg/dL — AB (ref 0–200)
HDL: 49.7 mg/dL (ref >39.00)
LDL CALC: 178 mg/dL — AB (ref 0–99)
NonHDL: 200.39
TRIGLYCERIDES: 111 mg/dL (ref 0.0–149.0)
Total CHOL/HDL Ratio: 5
VLDL: 22.2 mg/dL (ref 0.0–40.0)

## 2018-01-02 LAB — HEMOGLOBIN A1C: Hgb A1c MFr Bld: 5.3 % (ref 4.6–6.5)

## 2018-01-02 MED ORDER — NEOMYCIN-POLYMYXIN-HC 3.5-10000-1 OT SOLN: 3.0000 [drp] | Freq: Four times a day (QID) | OTIC | 0 refills | Status: AC

## 2018-01-02 MED ORDER — VARENICLINE TARTRATE 0.5 MG PO TABS: 0.5000 mg | ORAL_TABLET | Freq: Two times a day (BID) | ORAL | 0 refills | Status: AC

## 2018-01-02 NOTE — Progress Notes (Signed)
HPI:   Maria Vega is a 37 y.o. female, who is here today for her routine physical.  Last CPE: 25 years ago.  She follows regularly with gynecologist.  Regular exercise 3 or more time per week: None Following a healthy diet: Not consistently. She lives with her husband and daughter.  Chronic medical problems: Tobacco use disorder,HLD,and recurrently herpes infection.  Pap smear: Reported as current, she has appointment next week with Dr Julien Girt.   There is no immunization history for the selected administration types on file for this patient.  She drinks 4-10 beers weekly.  She has some concerns today.  Right ear ache, intermittently for years. It seems to be exacerbated when she is underwater and swimming pools. No drainage, hearing loss, or history of trauma. She has not tried OTC medications. It does resolve after a few days.  Tobacco use: In the past she has taken Chantix, which helped but she was not able to quit.  She had labs done yesterday through her employer, she was told to arrange appointment to discuss results with PCP. According to patient, LDL was not reported and glucose was 112.   Review of Systems  Constitutional: Negative for appetite change, fatigue and fever.  HENT: Positive for ear pain. Negative for dental problem, ear discharge, facial swelling, hearing loss, mouth sores, sore throat, trouble swallowing and voice change.   Eyes: Negative for redness and visual disturbance.  Respiratory: Negative for cough, shortness of breath and wheezing.   Cardiovascular: Negative for chest pain and leg swelling.  Gastrointestinal: Negative for abdominal pain, nausea and vomiting.       No changes in bowel habits.  Endocrine: Negative for cold intolerance, heat intolerance, polydipsia, polyphagia and polyuria.  Genitourinary: Negative for decreased urine volume, dysuria, hematuria, vaginal bleeding and vaginal discharge.  Musculoskeletal:  Negative for arthralgias, gait problem and myalgias.  Skin: Negative for color change and rash.  Allergic/Immunologic: Negative for environmental allergies.  Neurological: Negative for syncope, weakness and headaches.  Hematological: Negative for adenopathy. Does not bruise/bleed easily.  Psychiatric/Behavioral: Negative for confusion and sleep disturbance. The patient is not nervous/anxious.   All other systems reviewed and are negative.     Current Outpatient Medications on File Prior to Visit  Medication Sig Dispense Refill  . ibuprofen (ADVIL,MOTRIN) 600 MG tablet Take 1 tablet (600 mg total) by mouth every 6 (six) hours as needed. 30 tablet 1  . JUNEL FE 24 1-20 MG-MCG(24) tablet     . valACYclovir (VALTREX) 500 MG tablet Take 500 mg by mouth daily as needed (outbreaks).     No current facility-administered medications on file prior to visit.      Past Medical History:  Diagnosis Date  . AMA (advanced maternal age) primigravida 33+   . Bilateral fibroadenomas of breasts   . Depression   . Herpes   . History of shingles   . Hx of varicella     Past Surgical History:  Procedure Laterality Date  . BREAST BIOPSY Right   . BREAST SURGERY  2014   biopsy  . DILATION AND CURETTAGE OF UTERUS  2000  . FINGER SURGERY Right 1986   R index finger -tip amputed in door. Sewn up.  Marland Kitchen LAPAROTOMY N/A 09/26/2016   Procedure: LAPAROTOMY WITH RIGHT OVARIAN LUMPECTOMY, LEFT SALPINGOOPHORECTOMY WITH LEFT OVARIAN CYSTECTOMY AND PELVIC WASHINGS;  Surgeon: Marylynn Pearson, MD;  Location: [facility];  Service: Gynecology;  Laterality: N/A;  left SALPINGO OOPHORECTOMY  .  SALPINGOOPHORECTOMY Left 09/26/2016   Procedure: SALPINGO OOPHORECTOMY;  Surgeon: Marylynn Pearson, MD;  Location: [facility];  Service: Gynecology;  Laterality: Left;  . WISDOM TOOTH EXTRACTION  2002    No Known Allergies  Family History  Problem Relation Age of Onset  . Diabetes Mother   . Cancer Mother        breast  . Breast  cancer Mother 43  . Heart disease Father        6 vessel bypass, 2 aneurysms ( aorta and abd.)  . Cancer Father        oral  . Kidney disease Father     Social History   Socioeconomic History  . Marital status: Married    Spouse name: Not on file  . Number of children: Not on file  . Years of education: Not on file  . Highest education level: Not on file  Occupational History  . Not on file  Social Needs  . Financial resource strain: Not on file  . Food insecurity:    Worry: Not on file    Inability: Not on file  . Transportation needs:    Medical: Not on file    Non-medical: Not on file  Tobacco Use  . Smoking status: Former Smoker    Packs/day: 1.00    Years: 20.00    Pack years: 20.00    Types: Cigarettes  . Smokeless tobacco: Never Used  Substance and Sexual Activity  . Alcohol use: Yes    Alcohol/week: 4.0 standard drinks    Types: 4 Cans of beer per week  . Drug use: No  . Sexual activity: Yes    Birth control/protection: Pill  Lifestyle  . Physical activity:    Days per week: Not on file    Minutes per session: Not on file  . Stress: Not on file  Relationships  . Social connections:    Talks on phone: Not on file    Gets together: Not on file    Attends religious service: Not on file    Active member of club or organization: Not on file    Attends meetings of clubs or organizations: Not on file    Relationship status: Not on file  Other Topics Concern  . Not on file  Social History Narrative  . Not on file     Vitals:   01/02/18 0720  BP: 124/82  Pulse: 96  Resp: 12  Temp: 98.5 F (36.9 C)  SpO2: 97%   Body mass index is 30.78 kg/m.   Wt Readings from Last 3 Encounters:  01/02/18 196 lb 8 oz (89.1 kg)  09/26/16 194 lb (88 kg)  09/14/16 194 lb 4 oz (88.1 kg)     Physical Exam  Nursing note and vitals reviewed. Constitutional: She is oriented to person, place, and time. She appears well-developed. No distress.  HENT:  Head:  Normocephalic and atraumatic.  Right Ear: Hearing, tympanic membrane, external ear and ear canal normal.  Left Ear: Hearing, tympanic membrane, external ear and ear canal normal.  Mouth/Throat: Uvula is midline, oropharynx is clear and moist and mucous membranes are normal.  Mild tenderness right ear canal during otoscopic exam, no edema or erythema.  Eyes: Pupils are equal, round, and reactive to light. Conjunctivae and EOM are normal.  Neck: No tracheal deviation present. No thyromegaly present.  Cardiovascular: Normal rate and regular rhythm.  No murmur heard. Pulses:      Dorsalis pedis pulses are 2+ on the right  side, and 2+ on the left side.  Respiratory: Effort normal and breath sounds normal. No respiratory distress.  GI: Soft. She exhibits no mass. There is no hepatomegaly. There is no tenderness.  Genitourinary:  Genitourinary Comments: Deferred to gynecologist.  Musculoskeletal: She exhibits no edema.  No major deformity or signs of synovitis appreciated.  Lymphadenopathy:    She has no cervical adenopathy.       Right: No supraclavicular adenopathy present.       Left: No supraclavicular adenopathy present.  Neurological: She is alert and oriented to person, place, and time. She has normal strength. No cranial nerve deficit. Coordination and gait normal.  Reflex Scores:      Bicep reflexes are 2+ on the right side and 2+ on the left side.      Patellar reflexes are 2+ on the right side and 2+ on the left side. Skin: Skin is warm. No rash noted. No erythema.  Psychiatric: She has a normal mood and affect.  Well groomed, good eye contact.      ASSESSMENT AND PLAN:  Maria Vega was here today annual physical examination.   Orders Placed This Encounter  Procedures  . Flu Vaccine QUAD 36+ mos IM  . Tdap vaccine greater than or equal to 7yo IM  . Hemoglobin A1c  . Basic metabolic panel  . Lipid panel   Lab Results  Component Value Date   HGBA1C 5.3  01/02/2018   Lab Results  Component Value Date   CREATININE 0.84 01/02/2018   BUN 11 01/02/2018   NA 140 01/02/2018   K 4.4 01/02/2018   CL 106 01/02/2018   CO2 26 01/02/2018   Lab Results  Component Value Date   CHOL 250 (H) 01/02/2018   HDL 49.70 01/02/2018   LDLCALC 178 (H) 01/02/2018   TRIG 111.0 01/02/2018   CHOLHDL 5 01/02/2018     Routine general medical examination at a health care facility  We discussed the importance of regular physical activity and healthy diet for prevention of chronic illness and/or complications. Preventive guidelines reviewed. Vaccination up to date. She will continue following with her gyn for her female preventive care. Next CPE in a year.   Diabetes mellitus screening -     Hemoglobin A1c -     Basic metabolic panel    Earache symptoms in right ear No edema or erythema but mild tenderness during otoscopic exam,so topical abx recommended. Instructed about warning signs. Recommend avoiding trigger factors.  -     neomycin-polymyxin-hydrocortisone (CORTISPORIN) OTIC solution; Place 3 drops into the right ear 4 (four) times daily for 10 days.  Need for immunization against influenza -     Flu Vaccine QUAD 36+ mos IM  Need for Tdap vaccination -     Tdap vaccine greater than or equal to 7yo IM  Hyperlipidemia, mixed We will wait for pharmacologic recommendations until lipid panel results are back. For now recommend low-fat diet and decreasing beer intake.   Tobacco use disorder We discussed adverse effects of tobacco use and benefits of smoking cessation. After discussion of some pharmacologic options for smoking cessation, she would like to try Chantix again. Recommend Chantix 0.5 mg twice daily for 3 to 4 months.      Return in 1 year (on 01/03/2019) for CPE.          Nain Rudd G. [place], MD  [facility]. [place] office.

## 2018-01-02 NOTE — Assessment & Plan Note (Signed)
We will wait for pharmacologic recommendations until lipid panel results are back. For now recommend low-fat diet and decreasing beer intake.

## 2018-01-02 NOTE — Patient Instructions (Addendum)
A few things to remember from today's visit:   Routine general medical examination at a health care facility  Diabetes mellitus screening - Plan: Hemoglobin R4W, Basic metabolic panel  Hyperlipidemia, mixed - Plan: Lipid panel  Earache symptoms in right ear - Plan: neomycin-polymyxin-hydrocortisone (CORTISPORIN) OTIC solution  Tobacco use disorder - Plan: varenicline (CHANTIX) 0.5 MG tablet  Today you have you routine preventive visit.  At least 150 minutes of moderate exercise per week, daily brisk walking for 15-30 min is a good exercise option. Healthy diet low in saturated (animal) fats and sweets and consisting of fresh fruits and vegetables, lean meats such as fish and white chicken and whole grains.  These are some of recommendations for screening depending of age and risk factors:   - Vaccines:  Tdap vaccine every 10 years.  Given today.  Shingles vaccine recommended at age 35, could be given after 39 years of age but not sure about insurance coverage.   Pneumonia vaccines:  Prevnar 13 at 65 and Pneumovax at 37. Sometimes Pneumovax is giving earlier if history of smoking, lung disease,diabetes,kidney disease among some.    Screening for diabetes at age 98 and every 3 years.  Cervical cancer prevention:  Pap smear starts at 39 years of age and continues periodically until 39 years old in low risk women. Pap smear every 3 years between 76 and 72 years old. Pap smear every 3-5 years between women 69 and older if pap smear negative and HPV screening negative.   -Breast cancer: Mammogram: There is disagreement between experts about when to start screening in low risk asymptomatic female but recent recommendations are to start screening at 4 and not later than 39 years old , every 1-2 years and after 39 yo q 2 years. Screening is recommended until 39 years old but some women can continue screening depending of healthy issues.   Colon cancer screening: starts at 39 years  old until 40 years old.   Also recommended:  1. Dental visit- Brush and floss your teeth twice daily; visit your dentist twice a year. 2. Eye doctor- Get an eye exam at least every 2 years. 3. Helmet use- Always wear a helmet when riding a bicycle, motorcycle, rollerblading or skateboarding. 4. Safe sex- If you may be exposed to sexually transmitted infections, use a condom. 5. Seat belts- Seat belts can save your live; always wear one. 6. Smoke/Carbon Monoxide detectors- These detectors need to be installed on the appropriate level of your home. Replace batteries at least once a year. 7. Skin cancer- When out in the sun please cover up and use sunscreen 15 SPF or higher. 8. Violence- If anyone is threatening or hurting you, please tell your healthcare provider.  9. Drink alcohol in moderation- Limit alcohol intake to one drink or less per day. Never drink and drive.  Please be sure medication list is accurate. If a new problem present, please set up appointment sooner than planned today.

## 2018-01-02 NOTE — Assessment & Plan Note (Signed)
We discussed adverse effects of tobacco use and benefits of smoking cessation. After discussion of some pharmacologic options for smoking cessation, she would like to try Chantix again. Recommend Chantix 0.5 mg twice daily for 3 to 4 months.

## 2018-01-05 ENCOUNTER — Encounter: Payer: Self-pay | Admitting: Family Medicine

## 2018-01-31 DIAGNOSIS — Z01419 Encounter for gynecological examination (general) (routine) without abnormal findings: Secondary | ICD-10-CM | POA: Diagnosis not present

## 2018-01-31 DIAGNOSIS — Z6830 Body mass index (BMI) 30.0-30.9, adult: Secondary | ICD-10-CM | POA: Diagnosis not present

## 2019-05-11 IMAGING — CT CT ABD-PELV W/ CM
2 of 4 series · 14 of 46 positions shown, 16 images · IV contrast (ISOVUE 300)
Comparison: None.

CLINICAL DATA: Chronic periumbilical and lower abdominal pain

EXAM:
CT ABDOMEN AND PELVIS WITH CONTRAST
TECHNIQUE: Multidetector CT imaging of the abdomen and pelvis was performed
using the standard protocol following bolus administration of
intravenous contrast. Oral contrast was also administered.
CONTRAST:  100mL YGKTTF-Z99 IOPAMIDOL (YGKTTF-Z99) INJECTION 61%

[Series 2: abd/pel w · axial · 0.81mm/px · z∈[-396,-31]mm · 11 of 89 slices shown, 13 images]
[im 8/89  soft-tissue]
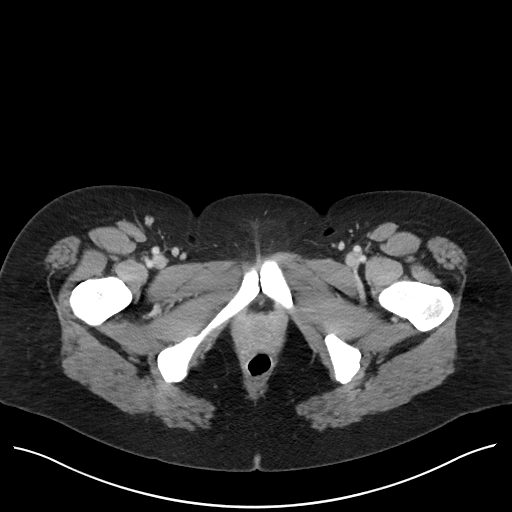
[im 8/89  bone]
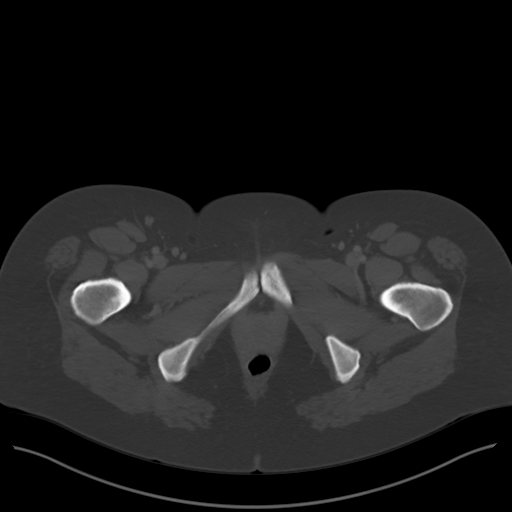
[im 15/89  soft-tissue]
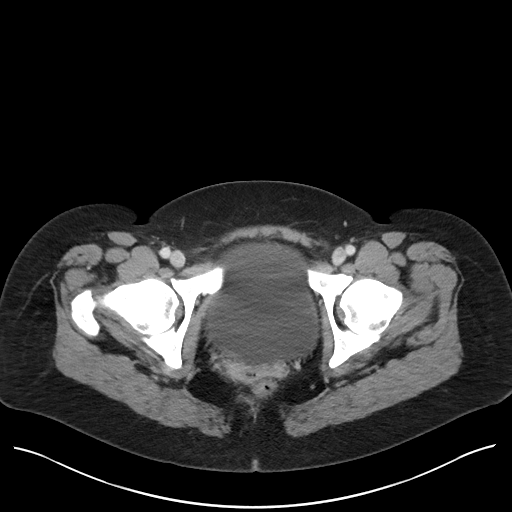
[im 23/89  soft-tissue]
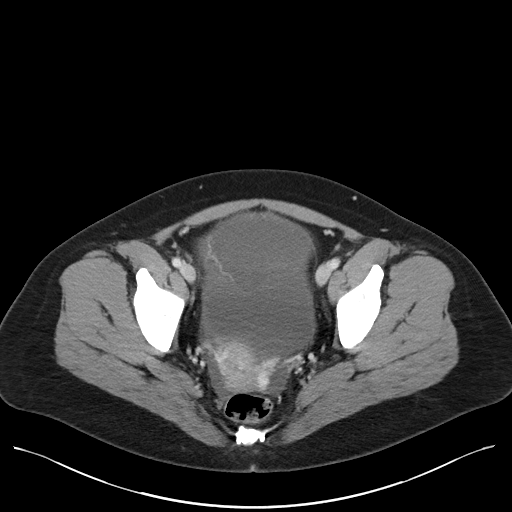
[im 30/89  soft-tissue]
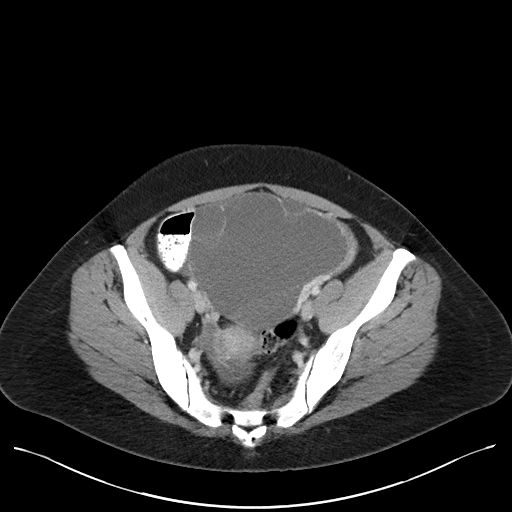
[im 37/89  soft-tissue]
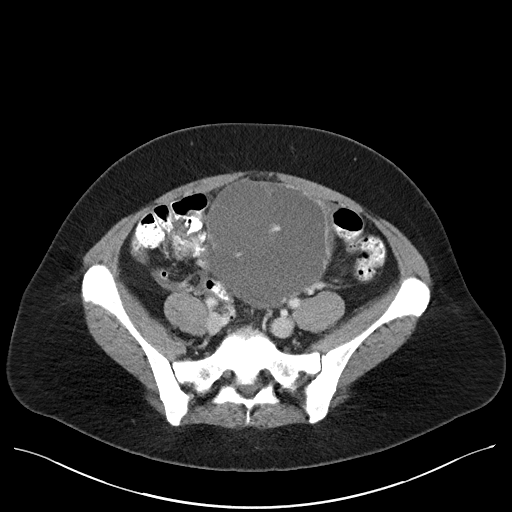
[im 45/89  soft-tissue]
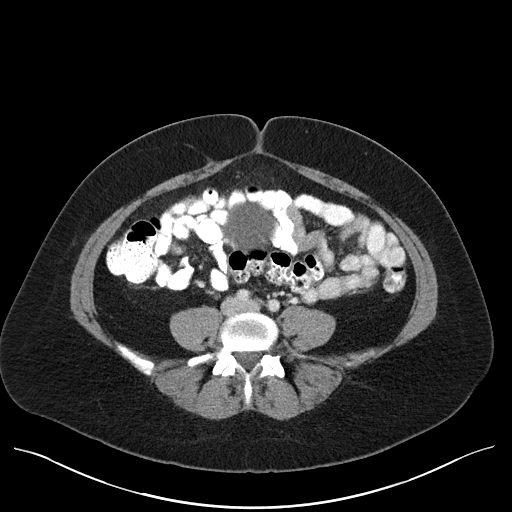
[im 52/89  soft-tissue]
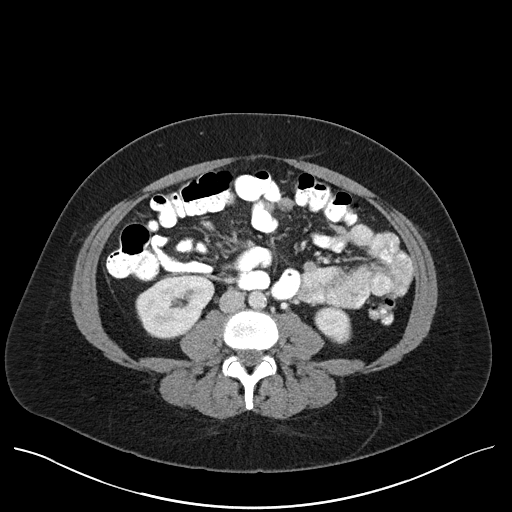
[im 59/89  soft-tissue]
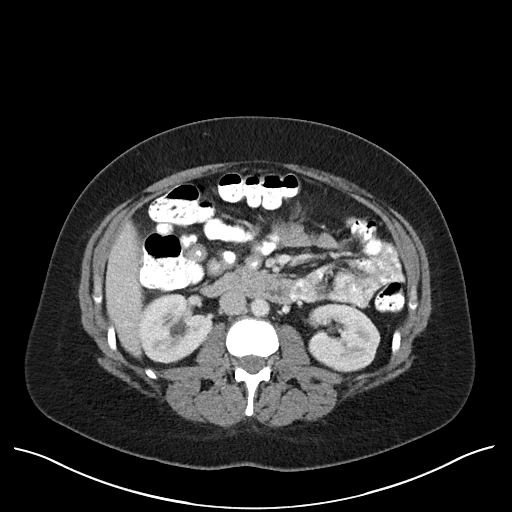
[im 67/89  soft-tissue]
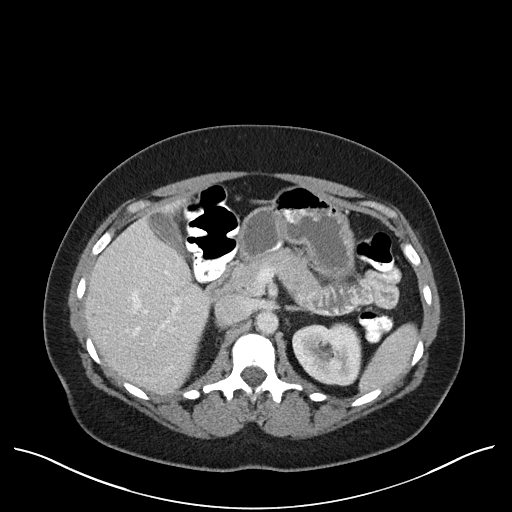
[im 67/89  bone]
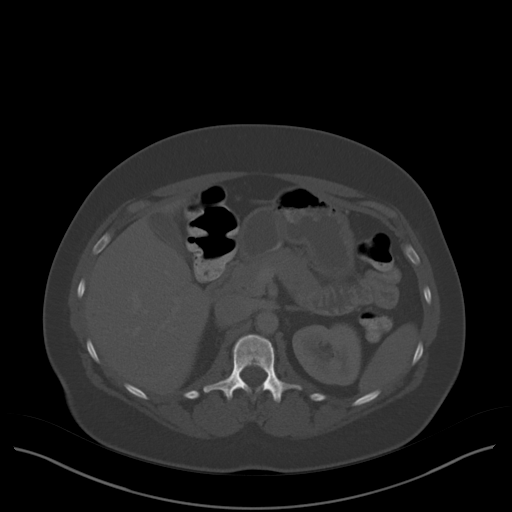
[im 74/89  soft-tissue]
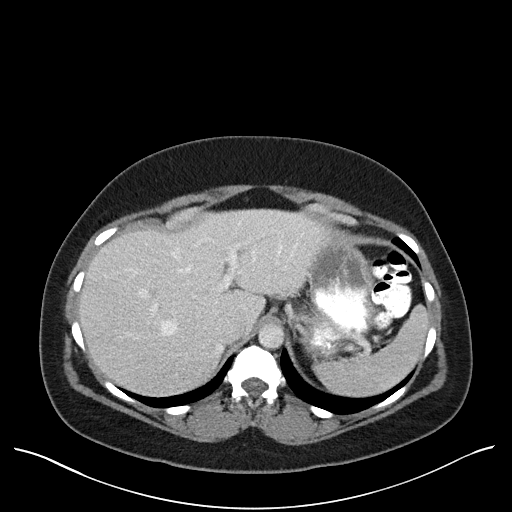
[im 81/89  soft-tissue]
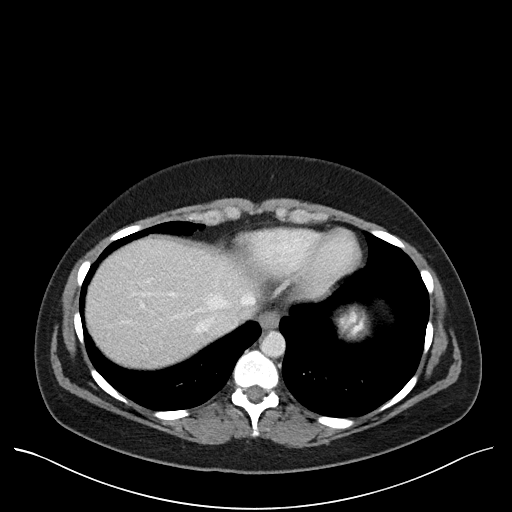

[Series 5: abd/pel w st · coronal · 0.64mm/px · 3 of 88 slices shown]
[im 30/88  soft-tissue]
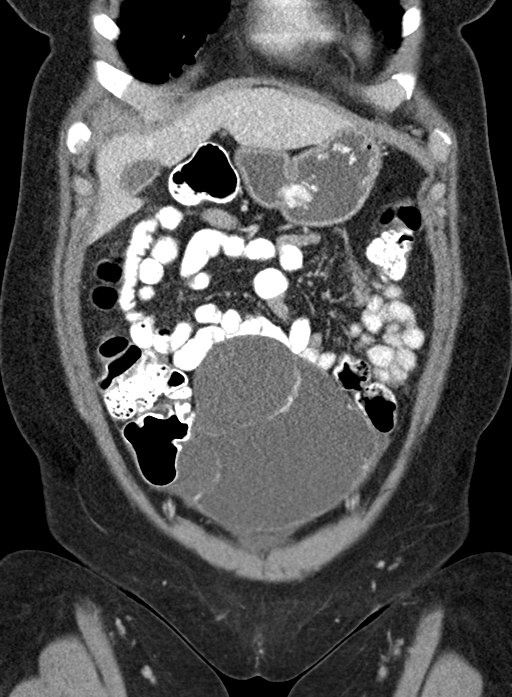
[im 39/88  soft-tissue]
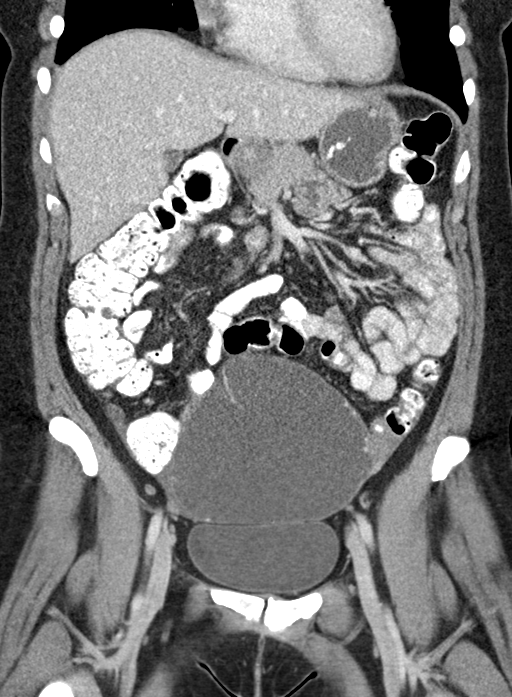
[im 49/88  soft-tissue]
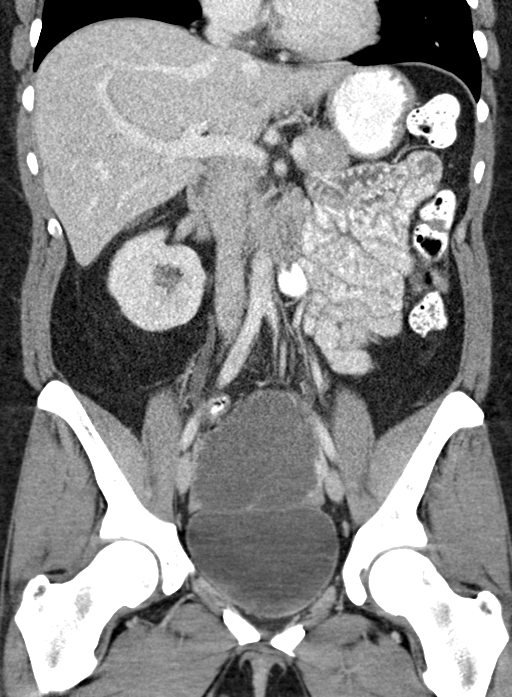

[14 of 46 positions shown; findings below may reference images not displayed]

FINDINGS: Lower chest: Lung bases are clear.

Hepatobiliary: No focal liver lesions are apparent. Gallbladder wall
is not appreciably thickened. There is no biliary duct dilatation.

Pancreas: No pancreatic mass or inflammatory focus.

Spleen: No splenic lesions are evident.

Adrenals/Urinary Tract: Adrenals appear normal bilaterally. Kidneys
bilaterally show no mass or hydronephrosis on either side. There is
no renal or ureteral calculus on either side. Urinary bladder is
midline with wall thickness within normal limits.

Stomach/Bowel: There is no appreciable bowel wall or mesenteric
thickening. No bowel obstruction. No free air or portal venous air.

Vascular/Lymphatic: There are foci of atherosclerotic calcification
in the aorta. No abdominal aortic aneurysm. Major mesenteric vessels
appear normal. There is no evident adenopathy in the abdomen or
pelvis.

Reproductive: The uterus is anteverted. Intrauterine device is
positioned with.

There is a large predominantly cystic mass which contains multiple
septations which is located anterior to the uterus and superior to
the urinary bladder in the midline, extending somewhat more to the
left into the right. This large predominantly cystic mass measures
12.7 cm from superior to inferior dimension. It measures 11.2 cm
from anterior to posterior dimension and 13.0 cm from right to left
dimension. There is a probable left ovarian mid left anteriorly and
laterally positioned, and this mass is felt to arise from the left
ovary given this appearance. There is no other pelvic mass. There is
moderate free fluid in the cul-de-sac region. Fluid from the pelvis
tracks into the right lower quadrant and surrounds the appendix.

Other: Appendix appears normal in size and contour. Fluid surrounds
the appendix, felt to arise from the pelvis. The appendix itself is
felt to be within normal limits. There is no abscess in the abdomen
or pelvis. There is no appreciable omental thickening or omental
mass.

Musculoskeletal: There are no blastic or lytic bone lesions. No
abdominal wall or intramuscular lesion.
IMPRESSION: Large predominantly cystic but somewhat complex mass anterior
superior urinary bladder in the midline which appears to arise
eccentrically from the left ovary which appears somewhat anterior
and lateral in location. This mass measures 12.7 x 13.0 x 11.2 cm.
Major differential considerations for this mass include ovarian
cystadenoma or cystadenocarcinoma.

There is moderate free fluid in cul-de-sac. Fluid is seen tracking
in the right lower quadrant and surrounds the appendix. Appendix
itself appears normal otherwise without inflammation, thickening, or
enlargement. Oral contrast is seen in the appendix, a finding that
supports impression of normal appendix.

Intrauterine device positioned within the endometrium.

No bowel obstruction. No abscess. No adenopathy or omental lesions.
No liver lesions.

There is aortic atherosclerosis.

These results will be called to the ordering clinician or
representative by the Radiologist Assistant, and communication
documented in the PACS or zVision Dashboard.

## 2019-09-04 DIAGNOSIS — R87619 Unspecified abnormal cytological findings in specimens from cervix uteri: Secondary | ICD-10-CM | POA: Insufficient documentation

## 2019-09-04 DIAGNOSIS — A6000 Herpesviral infection of urogenital system, unspecified: Secondary | ICD-10-CM | POA: Insufficient documentation

## 2021-03-28 ENCOUNTER — Ambulatory Visit
Admission: EM | Admit: 2021-03-28 | Discharge: 2021-03-28 | Disposition: A | Discharge status: Home / Self Care | Payer: BC Managed Care – PPO | Attending: Internal Medicine | Admitting: Internal Medicine

## 2021-03-28 ENCOUNTER — Encounter: Payer: Self-pay | Admitting: Emergency Medicine

## 2021-03-28 ENCOUNTER — Other Ambulatory Visit: Payer: Self-pay

## 2021-03-28 DIAGNOSIS — M542 Cervicalgia: Secondary | ICD-10-CM

## 2021-03-28 DIAGNOSIS — M436 Torticollis: Secondary | ICD-10-CM

## 2021-03-28 MED ORDER — CYCLOBENZAPRINE HCL 5 MG PO TABS: 5.0000 mg | ORAL_TABLET | Freq: Two times a day (BID) | ORAL | 0 refills | Status: DC | PRN

## 2021-03-28 MED ORDER — IBUPROFEN 600 MG PO TABS: 600.0000 mg | ORAL_TABLET | Freq: Four times a day (QID) | ORAL | 0 refills | Status: DC | PRN

## 2021-03-28 NOTE — ED Provider Notes (Signed)
[facility]    CSN: [id number] Arrival date & time: 03/28/21  1418      History   Chief Complaint Chief Complaint  Patient presents with   Torticollis    HPI Maria Vega is a 41 y.o. female.   Patient presents with neck stiffness and neck pain that started approximately 5 days ago.  Denies any apparent injury.  Patient reports that she woke up with symptoms.  Denies any history of chronic neck pain.  Denies fevers, headache, upper respiratory symptoms.  Has taken ibuprofen with minimal improvement in symptoms.  She also has elevated blood pressure reading.  Denies history of hypertension.  Denies chest pain, shortness of breath, headache, nausea, vomiting, dizziness, blurred vision.    Past Medical History:  Diagnosis Date   AMA (advanced maternal age) primigravida 35+    Bilateral fibroadenomas of breasts    Depression    Herpes    History of shingles    Hx of varicella     Patient Active Problem List   Diagnosis Date Noted   Tobacco use disorder 01/02/2018   Hyperlipidemia, mixed 01/02/2018   Left ovarian cyst 09/26/2016   SVD (spontaneous vaginal delivery) 06/06/2014   Post-dates pregnancy 06/04/2014    Past Surgical History:  Procedure Laterality Date   BREAST BIOPSY Right    BREAST SURGERY  2014   biopsy   DILATION AND CURETTAGE OF UTERUS  2000   FINGER SURGERY Right 1986   R index finger -tip amputed in door. Sewn up.   LAPAROTOMY N/A 09/26/2016   Procedure: LAPAROTOMY WITH RIGHT OVARIAN LUMPECTOMY, LEFT SALPINGOOPHORECTOMY WITH LEFT OVARIAN CYSTECTOMY AND PELVIC WASHINGS;  Surgeon: Marylynn Pearson, MD;  Location: [facility];  Service: Gynecology;  Laterality: N/A;  left SALPINGO OOPHORECTOMY   SALPINGOOPHORECTOMY Left 09/26/2016   Procedure: SALPINGO OOPHORECTOMY;  Surgeon: Marylynn Pearson, MD;  Location: [facility];  Service: Gynecology;  Laterality: Left;   WISDOM TOOTH EXTRACTION  2002    OB History     Gravida  2   Para  1    Term  1   Preterm      AB  1   Living  1      SAB      IAB      Ectopic      Multiple  0   Live Births  1            Home Medications    Prior to Admission medications   Medication Sig Start Date End Date Taking? Authorizing Provider  cyclobenzaprine (FLEXERIL) 5 MG tablet Take 1 tablet (5 mg total) by mouth 2 (two) times daily as needed for muscle spasms. 03/28/21  Yes Alphonse Asbridge, Hildred Alamin E, FNP  ibuprofen (ADVIL) 600 MG tablet Take 1 tablet (600 mg total) by mouth every 6 (six) hours as needed for mild pain. 03/28/21  Yes Madalen Gavin, Michele Rockers, FNP  JUNEL FE 24 1-20 MG-MCG(24) tablet  10/20/17   [provider]  valACYclovir (VALTREX) 500 MG tablet Take 500 mg by mouth daily as needed (outbreaks).    [provider]    Family History Family History  Problem Relation Age of Onset   Diabetes Mother    Cancer Mother        breast   Breast cancer Mother 11   Heart disease Father        6 vessel bypass, 2 aneurysms ( aorta and abd.)   Cancer Father  oral   Kidney disease Father     Social History Social History   Tobacco Use   Smoking status: Former    Packs/day: 1.00    Years: 20.00    Pack years: 20.00    Types: Cigarettes   Smokeless tobacco: Never  Substance Use Topics   Alcohol use: Yes    Alcohol/week: 4.0 standard drinks    Types: 4 Cans of beer per week   Drug use: No     Allergies   Patient has no known allergies.   Review of Systems Review of Systems Per HPI  Physical Exam Triage Vital Signs ED Triage Vitals  Enc Vitals Group     BP 03/28/21 1542 (!) 178/110     Pulse Rate 03/28/21 1542 67     Resp 03/28/21 1542 16     Temp 03/28/21 1542 97.8 F (36.6 C)     Temp Source 03/28/21 1542 Oral     SpO2 03/28/21 1542 98 %     Weight --      Height --      Head Circumference --      Peak Flow --      Pain Score 03/28/21 1541 7     Pain Loc --      Pain Edu? --      Excl. in [place]? --    No data found.  Updated Vital  Signs BP (!) 178/110 (BP Location: Left Arm)    Pulse 67    Temp 97.8 F (36.6 C) (Oral)    Resp 16    SpO2 98%   Visual Acuity Right Eye Distance:   Left Eye Distance:   Bilateral Distance:    Right Eye Near:   Left Eye Near:    Bilateral Near:     Physical Exam Constitutional:      General: She is not in acute distress.    Appearance: Normal appearance. She is not toxic-appearing or diaphoretic.  HENT:     Head: Normocephalic and atraumatic.  Eyes:     Extraocular Movements: Extraocular movements intact.     Conjunctiva/sclera: Conjunctivae normal.  Neck:     Comments: No tenderness to palpation.  No direct spinal tenderness, crepitus, step-off.  Patient has pain with range of motion at the left paraspinal muscles. Pulmonary:     Effort: Pulmonary effort is normal.  Musculoskeletal:     Cervical back: No edema, erythema or crepitus. Pain with movement and muscular tenderness present. No spinous process tenderness. Decreased range of motion.  Neurological:     General: No focal deficit present.     Mental Status: She is alert and oriented to person, place, and time. Mental status is at baseline.     Cranial Nerves: Cranial nerves 2-12 are intact.     Sensory: Sensation is intact.     Motor: Motor function is intact.     Coordination: Coordination is intact.     Gait: Gait is intact.  Psychiatric:        Mood and Affect: Mood normal.        Behavior: Behavior normal.        Thought Content: Thought content normal.        Judgment: Judgment normal.     UC Treatments / Results  Labs (all labs ordered are listed, but only abnormal results are displayed) Labs Reviewed - No data to display  EKG   Radiology No results found.  Procedures Procedures (including critical care time)  Medications Ordered in UC Medications - No data to display  Initial Impression / Assessment and Plan / UC Course  I have reviewed the triage vital signs and the nursing  notes.  Pertinent labs & imaging results that were available during my care of the patient were reviewed by me and considered in my medical decision making (see chart for details).     Physical exam is consistent with left cervical sprain.  Will treat with cyclobenzaprine and NSAIDs.  Advised patient that muscle relaxer can cause drowsiness.  Patient to alternate ice and heat to affected area as well.  Advised patient to not take any additional ibuprofen, Advil, Aleve over-the-counter while taking ibuprofen.  Do not think that imaging is necessary given no direct spinal tenderness and no apparent injury.  She also has elevated blood pressure reading with no signs of hypertensive urgency.  Neuro exam is normal and no signs of endorgan damage.  Patient to monitor blood pressure at home with blood pressure cuff and to follow-up with PCP or urgent care if blood pressure remains elevated.  Highly suspect that blood pressure is related to pain.  Discussed strict return precautions.  Patient verbalized understanding and was agreeable with plan. Final Clinical Impressions(s) / UC Diagnoses   Final diagnoses:  Torticollis, acute  Neck pain     Discharge Instructions      You have been prescribed muscle relaxer and ibuprofen.  Please be advised that muscle relaxer can cause drowsiness.  Please get home blood pressure cuff and monitor at home and follow-up if symptoms persist or worsen.    ED Prescriptions     Medication Sig Dispense Auth. Provider   cyclobenzaprine (FLEXERIL) 5 MG tablet Take 1 tablet (5 mg total) by mouth 2 (two) times daily as needed for muscle spasms. 15 tablet [place], [place] E, [place]   ibuprofen (ADVIL) 600 MG tablet Take 1 tablet (600 mg total) by mouth every 6 (six) hours as needed for mild pain. 30 tablet [place], [place] E, [place]      I have reviewed the PDMP during this encounter.   Teodora Medici, [place] 03/28/21 1623

## 2021-03-28 NOTE — ED Triage Notes (Signed)
Woke up unable to turn neck on Wednesday. Has been taking ibuprofen and using heat to manage without improvement. Pain is getting worse

## 2021-03-28 NOTE — Discharge Instructions (Signed)
You have been prescribed muscle relaxer and ibuprofen.  Please be advised that muscle relaxer can cause drowsiness.  Please get home blood pressure cuff and monitor at home and follow-up if symptoms persist or worsen.

## 2021-11-01 ENCOUNTER — Ambulatory Visit (INDEPENDENT_AMBULATORY_CARE_PROVIDER_SITE_OTHER): Payer: BC Managed Care – PPO | Admitting: Family Medicine

## 2021-11-01 ENCOUNTER — Encounter: Payer: Self-pay | Admitting: Family Medicine

## 2021-11-01 VITALS — BP 140/92 | HR 100 | Temp 98.2°F | Ht 67.0 in | Wt 198.3 lb

## 2021-11-01 DIAGNOSIS — R002 Palpitations: Secondary | ICD-10-CM | POA: Diagnosis not present

## 2021-11-01 DIAGNOSIS — I10 Essential (primary) hypertension: Secondary | ICD-10-CM | POA: Insufficient documentation

## 2021-11-01 DIAGNOSIS — F321 Major depressive disorder, single episode, moderate: Secondary | ICD-10-CM

## 2021-11-01 DIAGNOSIS — E782 Mixed hyperlipidemia: Secondary | ICD-10-CM

## 2021-11-01 DIAGNOSIS — Z803 Family history of malignant neoplasm of breast: Secondary | ICD-10-CM

## 2021-11-01 DIAGNOSIS — F17200 Nicotine dependence, unspecified, uncomplicated: Secondary | ICD-10-CM

## 2021-11-01 LAB — POCT GLYCOSYLATED HEMOGLOBIN (HGB A1C): Hemoglobin A1C: 5.3 % (ref 4.0–5.6)

## 2021-11-01 MED ORDER — LOSARTAN POTASSIUM 25 MG PO TABS: 25.0000 mg | ORAL_TABLET | Freq: Every day | ORAL | 1 refills | Status: DC

## 2021-11-01 MED ORDER — FLUOXETINE HCL 20 MG PO CAPS: 20.0000 mg | ORAL_CAPSULE | Freq: Every day | ORAL | 1 refills | Status: DC

## 2021-11-01 NOTE — Patient Instructions (Signed)
Start losartan 25 mg daily for blood pressure. Check your blood pressure daily, send me your readings in the next 2-3 weeks.  Start fluoxetine 20 mg daily for anxiety.  You may try pepcid 20 mg, pretreat 30 minutes before lunch, see if this helps with the palpitations.

## 2021-11-01 NOTE — Progress Notes (Unsigned)
New Patient Office Visit  Subjective    Patient ID: Maria Vega, female    DOB: Apr 17, 1980  Age: 41 y.o. MRN: [id number]  CC:  Chief Complaint  Patient presents with   Establish Care    HPI Maria Vega presents to establish care Patient is here to establish care with a new PCP. States that she has not been to see a PCP since before COVID. States that she has had elevated blood pressures in the past, states that she was also told she needed her blood sugar checked as well.  Patient reports that she is having episodes of heart racing. States that it usually happens after she eats. States that she will feel very sleepy after eating and during this time. The last 2 weeks it has been happening every day. States it lasts for about an hour, does not feel flushed, just like she is going to faint, feels tight in her upper chest and neck.   Outpatient Encounter Medications as of 11/01/2021  Medication Sig   FLUoxetine (PROZAC) 20 MG capsule Take 1 capsule (20 mg total) by mouth daily.   JUNEL FE 24 1-20 MG-MCG(24) tablet    losartan (COZAAR) 25 MG tablet Take 1 tablet (25 mg total) by mouth daily.   valACYclovir (VALTREX) 500 MG tablet Take 500 mg by mouth daily as needed (outbreaks).   [DISCONTINUED] cyclobenzaprine (FLEXERIL) 5 MG tablet Take 1 tablet (5 mg total) by mouth 2 (two) times daily as needed for muscle spasms.   [DISCONTINUED] ibuprofen (ADVIL) 600 MG tablet Take 1 tablet (600 mg total) by mouth every 6 (six) hours as needed for mild pain.   No facility-administered encounter medications on file as of 11/01/2021.    Past Medical History:  Diagnosis Date   AMA (advanced maternal age) primigravida 35+    Bilateral fibroadenomas of breasts    Depression    Herpes    History of shingles    Hx of varicella     Past Surgical History:  Procedure Laterality Date   BREAST BIOPSY Right    BREAST SURGERY  2014   biopsy   DILATION AND CURETTAGE OF UTERUS   2000   FINGER SURGERY Right 1986   R index finger -tip amputed in door. Sewn up.   LAPAROTOMY N/A 09/26/2016   Procedure: LAPAROTOMY WITH RIGHT OVARIAN LUMPECTOMY, LEFT SALPINGOOPHORECTOMY WITH LEFT OVARIAN CYSTECTOMY AND PELVIC WASHINGS;  Surgeon: Marylynn Pearson, MD;  Location: [facility];  Service: Gynecology;  Laterality: N/A;  left SALPINGO OOPHORECTOMY   SALPINGOOPHORECTOMY Left 09/26/2016   Procedure: SALPINGO OOPHORECTOMY;  Surgeon: Marylynn Pearson, MD;  Location: [facility];  Service: Gynecology;  Laterality: Left;   WISDOM TOOTH EXTRACTION  2002    Family History  Problem Relation Age of Onset   Diabetes Mother    Cancer Mother        breast   Breast cancer Mother 81   Heart disease Father        6 vessel bypass, 2 aneurysms ( aorta and abd.)   Cancer Father        oral   Kidney disease Father     Social History   Socioeconomic History   Marital status: Married    Spouse name: Not on file   Number of children: Not on file   Years of education: Not on file   Highest education level: Not on file  Occupational History   Not on file  Tobacco Use   Smoking status:  Every Day    Packs/day: 1.00    Years: 20.00    Total pack years: 20.00    Types: Cigarettes   Smokeless tobacco: Never  Vaping Use   Vaping Use: Never used  Substance and Sexual Activity   Alcohol use: Yes    Alcohol/week: 4.0 standard drinks of alcohol    Types: 4 Cans of beer per week   Drug use: No   Sexual activity: Yes    Birth control/protection: Pill  Other Topics Concern   Not on file  Social History Narrative   Not on file   Social Determinants of Health   Financial Resource Strain: Not on file  Food Insecurity: Not on file  Transportation Needs: Not on file  Physical Activity: Not on file  Stress: Not on file  Social Connections: Not on file  Intimate Partner Violence: Not on file    Review of Systems  All other systems reviewed and are negative.       Objective    BP (!)  140/92   Pulse 100   Temp 98.2 F (36.8 C) (Oral)   Ht '5\' 7"'$  (1.702 m)   Wt 198 lb 4.8 oz (89.9 kg)   LMP 10/29/2021 (Exact Date)   SpO2 96%   BMI 31.06 kg/m   Physical Exam Vitals reviewed.  Constitutional:      Appearance: Normal appearance. She is well-groomed and normal weight.  HENT:     Head: Normocephalic and atraumatic.     Mouth/Throat:     Mouth: Mucous membranes are moist.     Pharynx: Oropharynx is clear.  Eyes:     Extraocular Movements: Extraocular movements intact.     Conjunctiva/sclera: Conjunctivae normal.     Pupils: Pupils are equal, round, and reactive to light.  Cardiovascular:     Rate and Rhythm: Normal rate and regular rhythm.     Pulses: Normal pulses.     Heart sounds: S1 normal and S2 normal.  Pulmonary:     Effort: Pulmonary effort is normal.     Breath sounds: Normal breath sounds and air entry.  Abdominal:     General: Abdomen is flat. Bowel sounds are normal.     Palpations: Abdomen is soft.  Musculoskeletal:        General: Normal range of motion.     Cervical back: Normal range of motion and neck supple.     Right lower leg: No edema.     Left lower leg: No edema.  Skin:    General: Skin is warm and dry.  Neurological:     Mental Status: She is alert and oriented to person, place, and time. Mental status is at baseline.     Gait: Gait is intact.  Psychiatric:        Mood and Affect: Mood and affect normal.        Speech: Speech normal.        Behavior: Behavior normal.        Judgment: Judgment normal.     {Labs (Optional):23779}    Assessment & Plan:   Problem List Items Addressed This Visit       Cardiovascular and Mediastinum   HTN (hypertension) (Chronic)   Relevant Medications   losartan (COZAAR) 25 MG tablet   Other Relevant Orders   CMP   TSH   POC HgB A1c (Completed)     Other   Hyperlipidemia, mixed - Primary   Relevant Medications   losartan (COZAAR) 25 MG  tablet   Other Relevant Orders   CBC with  Differential/Platelets   Lipid Panel   Intermittent palpitations   Family history of breast cancer in first degree relative   Depression, major, single episode, moderate (HCC) (Chronic)   Relevant Medications   FLUoxetine (PROZAC) 20 MG capsule    Return in about 2 months (around 01/01/2022).   Farrel Conners, MD

## 2021-11-02 NOTE — Assessment & Plan Note (Signed)
Pt already in therapy, however we discussed the addition of medication to help with her symptoms, will start prozac 20 mg daily, I advised that it would take about 4-6 weeks to see an improvement, will have her return to clinic in 2 months to re-evaluate her symptoms

## 2021-11-02 NOTE — Assessment & Plan Note (Signed)
Unclear etiology, her sx could be from her uncontrolled HTN or from some other cause. I will start with bloodwork including CMP, CBC, lipid panel and TSH. Will control her BP with medication. If her symptoms are still present/ do not improve then I will place order for cardiology referral at the next visit.

## 2021-11-02 NOTE — Assessment & Plan Note (Signed)
BP elevated today, we discussed medications and I advised starting losartan 25 mg once daily. I advised she check her BP daily. I will see her back in the office in 2 months for BP recheck

## 2021-11-07 ENCOUNTER — Other Ambulatory Visit (INDEPENDENT_AMBULATORY_CARE_PROVIDER_SITE_OTHER): Payer: BC Managed Care – PPO

## 2021-11-07 DIAGNOSIS — I10 Essential (primary) hypertension: Secondary | ICD-10-CM | POA: Diagnosis not present

## 2021-11-07 DIAGNOSIS — E782 Mixed hyperlipidemia: Secondary | ICD-10-CM | POA: Diagnosis not present

## 2021-11-07 LAB — CBC WITH DIFFERENTIAL/PLATELET
Basophils Absolute: 0.1 10*3/uL (ref 0.0–0.1)
Basophils Relative: 0.7 % (ref 0.0–3.0)
Eosinophils Absolute: 0 10*3/uL (ref 0.0–0.7)
Eosinophils Relative: 0.4 % (ref 0.0–5.0)
HCT: 46.7 % — ABNORMAL HIGH (ref 36.0–46.0)
Hemoglobin: 16.1 g/dL — ABNORMAL HIGH (ref 12.0–15.0)
Lymphocytes Relative: 20.9 % (ref 12.0–46.0)
Lymphs Abs: 1.8 10*3/uL (ref 0.7–4.0)
MCHC: 34.5 g/dL (ref 30.0–36.0)
MCV: 104 fl — ABNORMAL HIGH (ref 78.0–100.0)
Monocytes Absolute: 0.8 10*3/uL (ref 0.1–1.0)
Monocytes Relative: 8.9 % (ref 3.0–12.0)
Neutro Abs: 5.9 10*3/uL (ref 1.4–7.7)
Neutrophils Relative %: 69.1 % (ref 43.0–77.0)
Platelets: 259 10*3/uL (ref 150.0–400.0)
RBC: 4.49 Mil/uL (ref 3.87–5.11)
RDW: 13.6 % (ref 11.5–15.5)
WBC: 8.6 10*3/uL (ref 4.0–10.5)

## 2021-11-07 LAB — LIPID PANEL
Cholesterol: 278 mg/dL — ABNORMAL HIGH (ref 0–200)
HDL: 66 mg/dL (ref >39.00)
LDL Cholesterol: 181 mg/dL — ABNORMAL HIGH (ref 0–99)
NonHDL: 212.12
Total CHOL/HDL Ratio: 4
Triglycerides: 158 mg/dL — ABNORMAL HIGH (ref 0.0–149.0)
VLDL: 31.6 mg/dL (ref 0.0–40.0)

## 2021-11-07 LAB — COMPREHENSIVE METABOLIC PANEL
ALT: 38 U/L — ABNORMAL HIGH (ref 0–35)
AST: 41 U/L — ABNORMAL HIGH (ref 0–37)
Albumin: 4.5 g/dL (ref 3.5–5.2)
Alkaline Phosphatase: 92 U/L (ref 39–117)
BUN: 9 mg/dL (ref 6–23)
CO2: 26 mEq/L (ref 19–32)
Calcium: 10.1 mg/dL (ref 8.4–10.5)
Chloride: 95 mEq/L — ABNORMAL LOW (ref 96–112)
Creatinine, Ser: 0.91 mg/dL (ref 0.40–1.20)
GFR: 77.6 mL/min (ref >60.00)
Glucose, Bld: 100 mg/dL — ABNORMAL HIGH (ref 70–99)
Potassium: 3.5 mEq/L (ref 3.5–5.1)
Sodium: 134 mEq/L — ABNORMAL LOW (ref 135–145)
Total Bilirubin: 0.8 mg/dL (ref 0.2–1.2)
Total Protein: 7.5 g/dL (ref 6.0–8.3)

## 2021-11-07 LAB — TSH: TSH: 2.6 u[IU]/mL (ref 0.35–5.50)

## 2021-11-08 ENCOUNTER — Telehealth: Payer: Self-pay | Admitting: Family Medicine

## 2021-11-08 DIAGNOSIS — E782 Mixed hyperlipidemia: Secondary | ICD-10-CM

## 2021-11-08 NOTE — Telephone Encounter (Signed)
Pt is in agreement with starting chole med and her pharm is  [facility] [place], [place] - [address] AT [address] [place] Phone:  [phone]  Fax:  [phone]

## 2021-11-10 MED ORDER — ATORVASTATIN CALCIUM 20 MG PO TABS: 20.0000 mg | ORAL_TABLET | Freq: Every day | ORAL | 3 refills | Status: DC

## 2021-11-10 NOTE — Telephone Encounter (Signed)
Noted  

## 2021-11-10 NOTE — Telephone Encounter (Signed)
Rx sent 

## 2021-11-14 ENCOUNTER — Telehealth: Payer: Self-pay | Admitting: Family Medicine

## 2021-11-14 NOTE — Telephone Encounter (Signed)
Pt called to ask if MD could possibly send something to the pharmacy to treat a UTI.  Pt was here on 11/01/21, to be established with MD. Pt was also here on 11/07/21, and had lab work done.  Please advise.  [facility] [phone] - [place], [address] AT [place] Phone:  [phone]  Fax:  [phone]

## 2021-11-14 NOTE — Telephone Encounter (Signed)
Spoke with the patient and informed her an appointment is needed for evaluation.  Appt was scheduled with Dr Elease Hashimoto on 8/29 as PCP does not have openings.

## 2021-11-15 ENCOUNTER — Encounter: Payer: Self-pay | Admitting: Family Medicine

## 2021-11-15 ENCOUNTER — Ambulatory Visit: Payer: BC Managed Care – PPO | Admitting: Family Medicine

## 2021-11-15 VITALS — BP 136/86 | HR 85 | Temp 98.7°F | Ht 67.0 in | Wt 194.8 lb

## 2021-11-15 DIAGNOSIS — R300 Dysuria: Secondary | ICD-10-CM | POA: Diagnosis not present

## 2021-11-15 LAB — POC URINALSYSI DIPSTICK (AUTOMATED)
Bilirubin, UA: NEGATIVE
Blood, UA: POSITIVE
Glucose, UA: NEGATIVE
Ketones, UA: NEGATIVE
Nitrite, UA: NEGATIVE
Protein, UA: NEGATIVE
Spec Grav, UA: 1.01 (ref 1.010–1.025)
Urobilinogen, UA: 0.2 E.U./dL
pH, UA: 6 (ref 5.0–8.0)

## 2021-11-15 MED ORDER — NITROFURANTOIN MONOHYD MACRO 100 MG PO CAPS: 100.0000 mg | ORAL_CAPSULE | Freq: Two times a day (BID) | ORAL | 0 refills | Status: DC

## 2021-11-15 NOTE — Progress Notes (Signed)
Established Patient Office Visit  Subjective   Patient ID: Maria Vega, female    DOB: 1980/07/10  Age: 41 y.o. MRN: [id number]  Chief Complaint  Patient presents with   Dysuria    Patient complains of dysuria, x1 week     HPI   Patient seen with urinary symptoms of burning and some frequency past 10 days or so.  Denies any fevers or chills.  No flank pain.  No nausea or vomiting.  She has 1 partner and has had same partner for 15 years.  She is not concerned about STD risk.  Denies any gross hematuria.  Past Medical History:  Diagnosis Date   AMA (advanced maternal age) primigravida 35+    Bilateral fibroadenomas of breasts    Depression    Herpes    History of shingles    Hx of varicella    Past Surgical History:  Procedure Laterality Date   BREAST BIOPSY Right    BREAST SURGERY  2014   biopsy   DILATION AND CURETTAGE OF UTERUS  2000   FINGER SURGERY Right 1986   R index finger -tip amputed in door. Sewn up.   LAPAROTOMY N/A 09/26/2016   Procedure: LAPAROTOMY WITH RIGHT OVARIAN LUMPECTOMY, LEFT SALPINGOOPHORECTOMY WITH LEFT OVARIAN CYSTECTOMY AND PELVIC WASHINGS;  Surgeon: Marylynn Pearson, MD;  Location: [facility];  Service: Gynecology;  Laterality: N/A;  left SALPINGO OOPHORECTOMY   SALPINGOOPHORECTOMY Left 09/26/2016   Procedure: SALPINGO OOPHORECTOMY;  Surgeon: Marylynn Pearson, MD;  Location: [facility];  Service: Gynecology;  Laterality: Left;   WISDOM TOOTH EXTRACTION  2002    reports that she has been smoking cigarettes. She has a 20.00 pack-year smoking history. She has never used smokeless tobacco. She reports current alcohol use of about 4.0 standard drinks of alcohol per week. She reports that she does not use drugs. family history includes Breast cancer (age of onset: 58) in her mother; Cancer in her father and mother; Diabetes in her mother; Heart disease in her father; Kidney disease in her father. No Known Allergies  Review of Systems  Constitutional:   Negative for chills and fever.  Gastrointestinal:  Negative for abdominal pain.  Genitourinary:  Positive for dysuria and frequency. Negative for hematuria.      Objective:     BP 136/86 (BP Location: Left Arm, Patient Position: Sitting, Cuff Size: Normal)   Pulse 85   Temp 98.7 F (37.1 C) (Oral)   Ht '5\' 7"'$  (1.702 m)   Wt 194 lb 12.8 oz (88.4 kg)   LMP 10/29/2021 (Exact Date)   SpO2 98%   BMI 30.51 kg/m    Physical Exam Vitals reviewed.  Constitutional:      Appearance: Normal appearance.  Cardiovascular:     Rate and Rhythm: Normal rate and regular rhythm.  Pulmonary:     Effort: Pulmonary effort is normal.     Breath sounds: Normal breath sounds.  Neurological:     Mental Status: She is alert.      Results for orders placed or performed in visit on 11/15/21  POCT Urinalysis Dipstick (Automated)  Result Value Ref Range   Color, UA Yellow    Clarity, UA clear    Glucose, UA Negative Negative   Bilirubin, UA negative    Ketones, UA negative    Spec Grav, UA 1.010 1.010 - 1.025   Blood, UA Positive    pH, UA 6.0 5.0 - 8.0   Protein, UA Negative Negative   Urobilinogen, UA  0.2 0.2 or 1.0 E.U./dL   Nitrite, UA negative    Leukocytes, UA Small (1+) (A) Negative      The 10-year ASCVD risk score (Arnett DK, et al., 2019) is: 5.1%    Assessment & Plan:   Problem List Items Addressed This Visit   None Visit Diagnoses     Dysuria    -  Primary   Relevant Orders   POCT Urinalysis Dipstick (Automated) (Completed)   Urine Culture     Urine dipstick reveals positive blood and leukocytes though she is on her menses currently.  Urine culture sent.  Push fluids.  Start Macrobid 1 twice daily for 5 days pending culture results.  Follow-up for any persistent or worsening symptoms.  No follow-ups on file.    Carolann Littler, MD

## 2021-11-17 LAB — URINE CULTURE
MICRO NUMBER:: 13848262
SPECIMEN QUALITY:: ADEQUATE

## 2022-01-02 ENCOUNTER — Encounter: Payer: Self-pay | Admitting: Family Medicine

## 2022-01-02 ENCOUNTER — Ambulatory Visit (INDEPENDENT_AMBULATORY_CARE_PROVIDER_SITE_OTHER): Payer: BC Managed Care – PPO | Admitting: Family Medicine

## 2022-01-02 VITALS — BP 158/100 | HR 88 | Temp 98.8°F | Ht 67.0 in | Wt 196.4 lb

## 2022-01-02 DIAGNOSIS — F321 Major depressive disorder, single episode, moderate: Secondary | ICD-10-CM | POA: Diagnosis not present

## 2022-01-02 DIAGNOSIS — I10 Essential (primary) hypertension: Secondary | ICD-10-CM | POA: Diagnosis not present

## 2022-01-02 DIAGNOSIS — Z23 Encounter for immunization: Secondary | ICD-10-CM

## 2022-01-02 MED ORDER — FLUOXETINE HCL 40 MG PO CAPS: 40.0000 mg | ORAL_CAPSULE | Freq: Every day | ORAL | 1 refills | Status: DC

## 2022-01-02 NOTE — Patient Instructions (Signed)
Increase the fluoxetine to 40 mg daily   Increase the pepcid to 20 mg twice a day.

## 2022-01-02 NOTE — Assessment & Plan Note (Signed)
20 mg prozac not effective, will increase to 40 mg dose daily and see her back in 2 months to re-evaluate her symptoms.

## 2022-01-02 NOTE — Assessment & Plan Note (Addendum)
Current hypertension medications:      Sig   losartan (COZAAR) 25 MG tablet (Taking) Take 1 tablet (25 mg total) by mouth daily.     BP is elevated again today, she reports she missed her morning dose of losartan. She is also reporting an increase in alcohol use (6-8 drinks every evening) which explains the LFT elevation on her previous set of labs. She reports that her BP at home is in the normal range. I advised we bring her back in 2 months and recheck her BP at that time.

## 2022-01-02 NOTE — Progress Notes (Signed)
Established Patient Office Visit  Subjective   Patient ID: Maria Vega, female    DOB: 06/03/1980  Age: 41 y.o. MRN: [id number]  Chief Complaint  Patient presents with   Follow-up    Pt is here for follow up of HTN and depression, We had started medication about 2 months ago.  Depression- pt reports that she doesn't think that the medication is helping much. States that she is in therapy, states that she had some trauma this year, has been drinking more this year, is drinking about 6 days per week, about 6-8 beer and cocktails every day. States that the day after she drinks she feels like she gets more nervous.   HTN-- pt reports he BP at home is better than the reading today. States she is compliant with the medication, no side effects reported.Pt reports that her BP was under 140 at home. Patient states she forgot to take her medication at home this morning, this can likely explain why her BP is elevated   Current Outpatient Medications  Medication Instructions   atorvastatin (LIPITOR) 20 mg, Oral, Daily   FLUoxetine (PROZAC) 40 mg, Oral, Daily   JUNEL FE 24 1-20 MG-MCG(24) tablet No dose, route, or frequency recorded.   losartan (COZAAR) 25 mg, Oral, Daily   valACYclovir (VALTREX) 500 mg, Oral, Daily PRN     Patient Active Problem List   Diagnosis Date Noted   HTN (hypertension) 11/01/2021   Intermittent palpitations 11/01/2021   Depression, major, single episode, moderate ([place]) 11/01/2021   Family history of breast cancer in first degree relative 11/01/2021   Tobacco use disorder 01/02/2018   Hyperlipidemia, mixed 01/02/2018   Left ovarian cyst 09/26/2016   SVD (spontaneous vaginal delivery) 06/06/2014   Post-dates pregnancy 06/04/2014      Review of Systems  All other systems reviewed and are negative.     Objective:     BP (!) 158/100 (BP Location: Right Arm, Patient Position: Sitting)   Pulse 88   Temp 98.8 F (37.1 C) (Oral)   Ht '5\' 7"'$  (1.702  m)   Wt 196 lb 6.4 oz (89.1 kg)   LMP 11/10/2021 (Exact Date)   SpO2 98%   BMI 30.76 kg/m    Physical Exam Vitals reviewed.  Constitutional:      Appearance: Normal appearance. She is well-groomed and normal weight.  Eyes:     Conjunctiva/sclera: Conjunctivae normal.  Neck:     Thyroid: No thyromegaly.  Cardiovascular:     Rate and Rhythm: Normal rate and regular rhythm.     Heart sounds: S1 normal and S2 normal.  Pulmonary:     Effort: Pulmonary effort is normal.  Neurological:     Mental Status: She is alert and oriented to person, place, and time. Mental status is at baseline.     Gait: Gait is intact.  Psychiatric:        Mood and Affect: Mood and affect normal.        Speech: Speech normal.        Behavior: Behavior normal.        Judgment: Judgment normal.      No results found for any visits on 01/02/22.    The 10-year ASCVD risk score (Arnett DK, et al., 2019) is: 6.9%    Assessment & Plan:   Problem List Items Addressed This Visit       Cardiovascular and Mediastinum   HTN (hypertension) - Primary (Chronic)    Current hypertension medications:  Sig   losartan (COZAAR) 25 MG tablet (Taking) Take 1 tablet (25 mg total) by mouth daily.  BP is elevated again today, she reports she missed her morning dose of losartan. She is also reporting an increase in alcohol use (6-8 drinks every evening) which explains the LFT elevation on her previous set of labs. She reports that her BP at home is in the normal range. I advised we bring her back in 2 months and recheck her BP at that time.        Other   Depression, major, single episode, moderate (HCC) (Chronic)    20 mg prozac not effective, will increase to 40 mg dose daily and see her back in 2 months to re-evaluate her symptoms.       Relevant Medications   FLUoxetine (PROZAC) 40 MG capsule   Other Visit Diagnoses     Immunization due       Relevant Orders   Flu Vaccine QUAD 6+ mos PF IM (Fluarix  Quad PF) (Completed)       Return in about 2 months (around 03/04/2022) for follow up on HTN and depression.    Farrel Conners, MD

## 2022-01-17 ENCOUNTER — Telehealth: Payer: Self-pay | Admitting: Family Medicine

## 2022-01-17 NOTE — Telephone Encounter (Signed)
Pt has 20 mg that she double up to make 40 mg and pt did not pick up new rx FLUoxetine (PROZAC) 40 MG capsule  [facility] [phone] - [place], [place] - [address] AT [place] Phone:  [phone]  Fax:  [phone]

## 2022-01-18 NOTE — Telephone Encounter (Signed)
Noted  

## 2022-01-24 ENCOUNTER — Telehealth: Payer: Self-pay | Admitting: Family Medicine

## 2022-01-24 NOTE — Telephone Encounter (Signed)
I called [facility] as the Rx was sent on 10/16.  Spoke with Jon Billings and she stated the Rx was put back since the patient did not pick this up after 10 days and the patient will be contacted when the Rx is ready for pick up today.  I called the patient and informed her of this also.

## 2022-01-24 NOTE — Telephone Encounter (Signed)
Pharmacy reshelved the fluoxetine 40 mg.  They need it to be resent but the pharmacy stated that they never received a resend of the prescription.  Patient has one day left.    Pharmacy- [facility] on [address].

## 2022-03-03 ENCOUNTER — Encounter: Payer: Self-pay | Admitting: Family Medicine

## 2022-03-03 ENCOUNTER — Ambulatory Visit: Payer: BC Managed Care – PPO | Admitting: Family Medicine

## 2022-03-03 VITALS — BP 140/100 | HR 90 | Temp 98.5°F | Ht 67.0 in | Wt 195.0 lb

## 2022-03-03 DIAGNOSIS — L700 Acne vulgaris: Secondary | ICD-10-CM

## 2022-03-03 DIAGNOSIS — E782 Mixed hyperlipidemia: Secondary | ICD-10-CM

## 2022-03-03 DIAGNOSIS — I10 Essential (primary) hypertension: Secondary | ICD-10-CM | POA: Diagnosis not present

## 2022-03-03 DIAGNOSIS — R7989 Other specified abnormal findings of blood chemistry: Secondary | ICD-10-CM

## 2022-03-03 LAB — HEPATIC FUNCTION PANEL
ALT: 68 U/L — ABNORMAL HIGH (ref 0–35)
AST: 91 U/L — ABNORMAL HIGH (ref 0–37)
Albumin: 4.8 g/dL (ref 3.5–5.2)
Alkaline Phosphatase: 93 U/L (ref 39–117)
Bilirubin, Direct: 0.2 mg/dL (ref 0.0–0.3)
Total Bilirubin: 0.8 mg/dL (ref 0.2–1.2)
Total Protein: 7.9 g/dL (ref 6.0–8.3)

## 2022-03-03 LAB — LIPID PANEL
Cholesterol: 208 mg/dL — ABNORMAL HIGH (ref 0–200)
HDL: 62.4 mg/dL (ref >39.00)
LDL Cholesterol: 123 mg/dL — ABNORMAL HIGH (ref 0–99)
NonHDL: 145.62
Total CHOL/HDL Ratio: 3
Triglycerides: 111 mg/dL (ref 0.0–149.0)
VLDL: 22.2 mg/dL (ref 0.0–40.0)

## 2022-03-03 MED ORDER — LOSARTAN POTASSIUM 50 MG PO TABS: 50.0000 mg | ORAL_TABLET | Freq: Every day | ORAL | 1 refills | Status: DC

## 2022-03-03 MED ORDER — TRETINOIN 0.025 % EX CREA: TOPICAL_CREAM | Freq: Every day | CUTANEOUS | 2 refills | Status: DC

## 2022-03-03 NOTE — Progress Notes (Unsigned)
Established Patient Office Visit  Subjective   Patient ID: Maria Vega, female    DOB: 27-Sep-1980  Age: 41 y.o. MRN: [id number]  No chief complaint on file.   Patient is here for follow up  HTN- -BP is improved today but still not quite at goal, states that she has been checking her BP at home and she is getting more normal numbers. She denies any chest pain, no SOB or headaches. Is tolerating the losartan well.   Depression-- pt is up to 40 mg prozac daily, she reports that it is working well to control her symptoms, she denies any side effects from the medication.   HLD-- was placed on statin medication 3 months ago. She reports compliance with her medication, denies any muscle cramps or other side effects. Needs new lipid panel and LFT's today.  Acne-- pt reports long history of relapsing/remitting pimples and acne on the forehead and cheeks ,states that it will get better for a while and then get worse. Reports that she washes her face twice a day and uses OTC benzoyl peroxide without improvement.    Current Outpatient Medications  Medication Instructions   atorvastatin (LIPITOR) 20 mg, Oral, Daily   FLUoxetine (PROZAC) 40 mg, Oral, Daily   losartan (COZAAR) 50 mg, Oral, Daily   norethindrone (MICRONOR) 0.35 MG tablet norethindrone (contraceptive) 0.35 mg tablet  TAKE 1 TABLET BY MOUTH EVERY DAY   tretinoin (RETIN-A) 0.025 % cream Topical, Daily at bedtime   valACYclovir (VALTREX) 500 mg, Oral, Daily PRN      Patient Active Problem List   Diagnosis Date Noted   HTN (hypertension) 11/01/2021   Intermittent palpitations 11/01/2021   Depression, major, single episode, moderate ([place]) 11/01/2021   Family history of breast cancer in first degree relative 11/01/2021   Tobacco use disorder 01/02/2018   Hyperlipidemia, mixed 01/02/2018   Left ovarian cyst 09/26/2016   SVD (spontaneous vaginal delivery) 06/06/2014   Post-dates pregnancy 06/04/2014     Review of  Systems  All other systems reviewed and are negative.     Objective:     BP (!) 140/100   Pulse 90   Temp 98.5 F (36.9 C) (Oral)   Ht '5\' 7"'$  (1.702 m)   Wt 195 lb (88.5 kg)   SpO2 98%   BMI 30.54 kg/m    Physical Exam Vitals reviewed.  Constitutional:      Appearance: Normal appearance. She is well-groomed and normal weight.  Cardiovascular:     Rate and Rhythm: Normal rate and regular rhythm.     Pulses: Normal pulses.     Heart sounds: S1 normal and S2 normal.  Pulmonary:     Effort: Pulmonary effort is normal.     Breath sounds: Normal breath sounds and air entry.  Musculoskeletal:     Right lower leg: No edema.     Left lower leg: No edema.  Neurological:     Mental Status: She is alert and oriented to person, place, and time. Mental status is at baseline.     Gait: Gait is intact.  Psychiatric:        Mood and Affect: Mood and affect normal.        Speech: Speech normal.        Behavior: Behavior normal.        Judgment: Judgment normal.      Last hemoglobin A1c Lab Results  Component Value Date   HGBA1C 5.3 11/01/2021      The  10-year ASCVD risk score (Arnett DK, et al., 2019) is: 3.6%    Assessment & Plan:   Problem List Items Addressed This Visit       Unprioritized   Hyperlipidemia, mixed    Needs new lipid panel today and LFT's to follow up on the statin medication      Relevant Medications   losartan (COZAAR) 50 MG tablet   Other Relevant Orders   Lipid Panel (Completed)   Hepatic Function Panel (Completed)   HTN (hypertension) - Primary (Chronic)    BP better but not at goal, will increase losartan to 50 mg daily and see her back again in 3 months for annual physical and BP follow up.       Relevant Medications   losartan (COZAAR) 50 MG tablet   Other Visit Diagnoses     Acne vulgaris       Relevant Medications   Seen on exam today, there is scarring from previous acne lesions, will start tretinoin 0.025% cream, once daily  application to the face. Followup in 3 months.   tretinoin (RETIN-A) 0.025 % cream       Return in about 3 months (around 06/02/2022) for Annual Physical Exam.    Farrel Conners, MD

## 2022-03-06 NOTE — Assessment & Plan Note (Signed)
BP better but not at goal, will increase losartan to 50 mg daily and see her back again in 3 months for annual physical and BP follow up.

## 2022-03-06 NOTE — Assessment & Plan Note (Signed)
Needs new lipid panel today and LFT's to follow up on the statin medication

## 2022-03-08 ENCOUNTER — Other Ambulatory Visit (HOSPITAL_COMMUNITY): Payer: Self-pay

## 2022-03-14 ENCOUNTER — Telehealth: Payer: Self-pay

## 2022-03-14 ENCOUNTER — Other Ambulatory Visit (HOSPITAL_COMMUNITY): Payer: Self-pay

## 2022-03-14 NOTE — Telephone Encounter (Signed)
Prior authorization is required for Tretinoin 0.025% cream. PA submitted and APPROVED on 03/14/2022.  Key [id number] Effective: 03/14/2022 - 03/13/23  Pharmacy aware

## 2022-04-07 ENCOUNTER — Other Ambulatory Visit: Payer: BC Managed Care – PPO

## 2022-04-26 ENCOUNTER — Other Ambulatory Visit: Payer: Self-pay | Admitting: Family Medicine

## 2022-04-26 DIAGNOSIS — F321 Major depressive disorder, single episode, moderate: Secondary | ICD-10-CM

## 2022-04-26 DIAGNOSIS — I10 Essential (primary) hypertension: Secondary | ICD-10-CM

## 2022-04-28 ENCOUNTER — Other Ambulatory Visit: Payer: BC Managed Care – PPO

## 2022-06-01 DIAGNOSIS — F331 Major depressive disorder, recurrent, moderate: Secondary | ICD-10-CM | POA: Diagnosis not present

## 2022-06-01 DIAGNOSIS — Z630 Problems in relationship with spouse or partner: Secondary | ICD-10-CM | POA: Diagnosis not present

## 2022-06-08 DIAGNOSIS — F331 Major depressive disorder, recurrent, moderate: Secondary | ICD-10-CM | POA: Diagnosis not present

## 2022-06-22 DIAGNOSIS — F331 Major depressive disorder, recurrent, moderate: Secondary | ICD-10-CM | POA: Diagnosis not present

## 2022-06-22 DIAGNOSIS — Z630 Problems in relationship with spouse or partner: Secondary | ICD-10-CM | POA: Diagnosis not present

## 2022-07-06 DIAGNOSIS — F331 Major depressive disorder, recurrent, moderate: Secondary | ICD-10-CM | POA: Diagnosis not present

## 2022-07-06 DIAGNOSIS — Z630 Problems in relationship with spouse or partner: Secondary | ICD-10-CM | POA: Diagnosis not present

## 2022-07-20 DIAGNOSIS — Z630 Problems in relationship with spouse or partner: Secondary | ICD-10-CM | POA: Diagnosis not present

## 2022-07-20 DIAGNOSIS — F331 Major depressive disorder, recurrent, moderate: Secondary | ICD-10-CM | POA: Diagnosis not present

## 2022-08-03 DIAGNOSIS — F331 Major depressive disorder, recurrent, moderate: Secondary | ICD-10-CM | POA: Diagnosis not present

## 2022-08-03 DIAGNOSIS — Z630 Problems in relationship with spouse or partner: Secondary | ICD-10-CM | POA: Diagnosis not present

## 2022-08-17 DIAGNOSIS — Z630 Problems in relationship with spouse or partner: Secondary | ICD-10-CM | POA: Diagnosis not present

## 2022-08-17 DIAGNOSIS — F330 Major depressive disorder, recurrent, mild: Secondary | ICD-10-CM | POA: Diagnosis not present

## 2022-09-04 ENCOUNTER — Other Ambulatory Visit: Payer: Self-pay | Admitting: Family Medicine

## 2022-09-04 DIAGNOSIS — F321 Major depressive disorder, single episode, moderate: Secondary | ICD-10-CM

## 2022-09-07 ENCOUNTER — Other Ambulatory Visit: Payer: Self-pay | Admitting: Family Medicine

## 2022-09-07 DIAGNOSIS — Z630 Problems in relationship with spouse or partner: Secondary | ICD-10-CM | POA: Diagnosis not present

## 2022-09-07 DIAGNOSIS — F331 Major depressive disorder, recurrent, moderate: Secondary | ICD-10-CM | POA: Diagnosis not present

## 2022-09-07 DIAGNOSIS — I10 Essential (primary) hypertension: Secondary | ICD-10-CM

## 2022-10-05 ENCOUNTER — Other Ambulatory Visit: Payer: Self-pay | Admitting: Family Medicine

## 2022-10-05 DIAGNOSIS — I10 Essential (primary) hypertension: Secondary | ICD-10-CM

## 2022-11-09 ENCOUNTER — Other Ambulatory Visit: Payer: Self-pay | Admitting: Family Medicine

## 2022-11-09 DIAGNOSIS — I10 Essential (primary) hypertension: Secondary | ICD-10-CM

## 2022-12-16 ENCOUNTER — Other Ambulatory Visit: Payer: Self-pay | Admitting: Family Medicine

## 2022-12-16 DIAGNOSIS — F321 Major depressive disorder, single episode, moderate: Secondary | ICD-10-CM

## 2022-12-22 NOTE — Telephone Encounter (Signed)
Pt requesting refill FLUoxetine (PROZAC) 40 MG capsule has CPE on 01/16/23 says she will run out prior to that

## 2023-01-16 ENCOUNTER — Encounter: Payer: Self-pay | Admitting: Family Medicine

## 2023-01-16 ENCOUNTER — Ambulatory Visit (INDEPENDENT_AMBULATORY_CARE_PROVIDER_SITE_OTHER): Payer: 59 | Admitting: Family Medicine

## 2023-01-16 VITALS — BP 128/88 | HR 80 | Temp 98.3°F | Ht 67.0 in | Wt 204.4 lb

## 2023-01-16 DIAGNOSIS — I10 Essential (primary) hypertension: Secondary | ICD-10-CM | POA: Diagnosis not present

## 2023-01-16 DIAGNOSIS — E782 Mixed hyperlipidemia: Secondary | ICD-10-CM | POA: Diagnosis not present

## 2023-01-16 DIAGNOSIS — F321 Major depressive disorder, single episode, moderate: Secondary | ICD-10-CM

## 2023-01-16 DIAGNOSIS — Z23 Encounter for immunization: Secondary | ICD-10-CM | POA: Diagnosis not present

## 2023-01-16 DIAGNOSIS — Z0000 Encounter for general adult medical examination without abnormal findings: Secondary | ICD-10-CM | POA: Diagnosis not present

## 2023-01-16 LAB — COMPREHENSIVE METABOLIC PANEL
ALT: 23 U/L (ref 0–35)
AST: 22 U/L (ref 0–37)
Albumin: 4.3 g/dL (ref 3.5–5.2)
Alkaline Phosphatase: 91 U/L (ref 39–117)
BUN: 12 mg/dL (ref 6–23)
CO2: 26 meq/L (ref 19–32)
Calcium: 9.6 mg/dL (ref 8.4–10.5)
Chloride: 101 meq/L (ref 96–112)
Creatinine, Ser: 0.83 mg/dL (ref 0.40–1.20)
GFR: 85.93 mL/min (ref >60.00)
Glucose, Bld: 97 mg/dL (ref 70–99)
Potassium: 3.6 meq/L (ref 3.5–5.1)
Sodium: 136 meq/L (ref 135–145)
Total Bilirubin: 0.7 mg/dL (ref 0.2–1.2)
Total Protein: 7.3 g/dL (ref 6.0–8.3)

## 2023-01-16 LAB — CBC WITH DIFFERENTIAL/PLATELET
Basophils Absolute: 0.2 10*3/uL — ABNORMAL HIGH (ref 0.0–0.1)
Basophils Relative: 1.7 % (ref 0.0–3.0)
Eosinophils Absolute: 0.1 10*3/uL (ref 0.0–0.7)
Eosinophils Relative: 1.5 % (ref 0.0–5.0)
HCT: 48.2 % — ABNORMAL HIGH (ref 36.0–46.0)
Hemoglobin: 16.1 g/dL — ABNORMAL HIGH (ref 12.0–15.0)
Lymphocytes Relative: 25 % (ref 12.0–46.0)
Lymphs Abs: 2.4 10*3/uL (ref 0.7–4.0)
MCHC: 33.4 g/dL (ref 30.0–36.0)
MCV: 108.8 fL — ABNORMAL HIGH (ref 78.0–100.0)
Monocytes Absolute: 0.6 10*3/uL (ref 0.1–1.0)
Monocytes Relative: 6.1 % (ref 3.0–12.0)
Neutro Abs: 6.3 10*3/uL (ref 1.4–7.7)
Neutrophils Relative %: 65.7 % (ref 43.0–77.0)
Platelets: 311 10*3/uL (ref 150.0–400.0)
RBC: 4.43 Mil/uL (ref 3.87–5.11)
RDW: 14.5 % (ref 11.5–15.5)
WBC: 9.6 10*3/uL (ref 4.0–10.5)

## 2023-01-16 LAB — LIPID PANEL
Cholesterol: 313 mg/dL — ABNORMAL HIGH (ref 0–200)
HDL: 50.5 mg/dL (ref >39.00)
LDL Cholesterol: 219 mg/dL — ABNORMAL HIGH (ref 0–99)
NonHDL: 262.39
Total CHOL/HDL Ratio: 6
Triglycerides: 215 mg/dL — ABNORMAL HIGH (ref 0.0–149.0)
VLDL: 43 mg/dL — ABNORMAL HIGH (ref 0.0–40.0)

## 2023-01-16 MED ORDER — LOSARTAN POTASSIUM 50 MG PO TABS: 50.0000 mg | ORAL_TABLET | Freq: Every day | ORAL | 1 refills | Status: DC

## 2023-01-16 MED ORDER — FLUOXETINE HCL 40 MG PO CAPS: 40.0000 mg | ORAL_CAPSULE | Freq: Every day | ORAL | 1 refills | Status: DC

## 2023-01-16 NOTE — Progress Notes (Signed)
Complete physical exam  Patient: Maria Vega   DOB: 09-May-1980   42 y.o. Female  MRN: [id number]  Subjective:    Chief Complaint  Patient presents with   Annual Exam    Maria Vega is a 42 y.o. female who presents today for a complete physical exam. She reports consuming a general diet. Home exercise routine includes walking 0.5 hrs per day. She generally feels well. She reports sleeping sx of perimenopause, night sweats, hot flashes, slightly more anxiety sometimes. She does not have additional problems to discuss today.   Reviewed HM, pt has pap scheduled for next month.   Most recent fall risk assessment:     No data to display           Most recent depression screenings:    01/16/2023   10:28 AM 03/03/2022    3:36 PM  PHQ 2/9 Scores  PHQ - 2 Score 0 1  PHQ- 9 Score 2 4    Vision:Within last year and no vision issues currently and Dental: No current dental problems and Receives regular dental care  Patient Active Problem List   Diagnosis Date Noted   HTN (hypertension) 11/01/2021   Intermittent palpitations 11/01/2021   Depression, major, single episode, moderate (HCC) 11/01/2021   Family history of breast cancer in first degree relative 11/01/2021   Tobacco use disorder 01/02/2018   Hyperlipidemia, mixed 01/02/2018   Left ovarian cyst 09/26/2016   SVD (spontaneous vaginal delivery) 06/06/2014   Post-dates pregnancy 06/04/2014      Patient Care Team: Karie Georges, MD as PCP - General (Family Medicine)   Outpatient Medications Prior to Visit  Medication Sig   norethindrone (MICRONOR) 0.35 MG tablet norethindrone (contraceptive) 0.35 mg tablet  TAKE 1 TABLET BY MOUTH EVERY DAY   valACYclovir (VALTREX) 500 MG tablet Take 500 mg by mouth daily as needed (outbreaks).   [DISCONTINUED] FLUoxetine (PROZAC) 40 MG capsule TAKE 1 CAPSULE BY MOUTH EVERY DAY   [DISCONTINUED] losartan (COZAAR) 50 MG tablet TAKE 1 TABLET BY MOUTH EVERY DAY    [DISCONTINUED] atorvastatin (LIPITOR) 20 MG tablet Take 1 tablet (20 mg total) by mouth daily.   [DISCONTINUED] tretinoin (RETIN-A) 0.025 % cream Apply topically at bedtime.   No facility-administered medications prior to visit.    Review of Systems  HENT:  Negative for hearing loss.   Eyes:  Negative for blurred vision.  Respiratory:  Negative for shortness of breath.   Cardiovascular:  Negative for chest pain.  Gastrointestinal: Negative.   Genitourinary: Negative.   Musculoskeletal:  Negative for back pain.  Neurological:  Negative for headaches.  Psychiatric/Behavioral:  Negative for depression.   All other systems reviewed and are negative.      Objective:     BP 128/88 (BP Location: Left Arm, Patient Position: Sitting, Cuff Size: Large)   Pulse 80   Temp 98.3 F (36.8 C) (Oral)   Ht 5\' 7"  (1.702 m)   Wt 204 lb 6.4 oz (92.7 kg)   LMP 12/17/2022 (Exact Date)   SpO2 96%   BMI 32.01 kg/m    Physical Exam Vitals reviewed.  Constitutional:      Appearance: Normal appearance. She is well-groomed and normal weight.  HENT:     Right Ear: Tympanic membrane and ear canal normal.     Left Ear: Tympanic membrane and ear canal normal.     Mouth/Throat:     Mouth: Mucous membranes are moist.     Pharynx: No posterior  oropharyngeal erythema.  Eyes:     Conjunctiva/sclera: Conjunctivae normal.  Neck:     Thyroid: No thyromegaly.  Cardiovascular:     Rate and Rhythm: Normal rate and regular rhythm.     Pulses: Normal pulses.     Heart sounds: S1 normal and S2 normal.  Pulmonary:     Effort: Pulmonary effort is normal.     Breath sounds: Normal breath sounds and air entry.  Abdominal:     General: Abdomen is flat. Bowel sounds are normal.     Palpations: Abdomen is soft.  Musculoskeletal:     Right lower leg: No edema.     Left lower leg: No edema.  Lymphadenopathy:     Cervical: No cervical adenopathy.  Neurological:     Mental Status: She is alert and oriented  to person, place, and time. Mental status is at baseline.     Gait: Gait is intact.  Psychiatric:        Mood and Affect: Mood and affect normal.        Speech: Speech normal.        Behavior: Behavior normal.        Judgment: Judgment normal.      No results found for any visits on 01/16/23.     Assessment & Plan:    Routine Health Maintenance and Physical Exam  Immunization History  Administered Date(s) Administered   Influenza, Seasonal, Injecte, Preservative Fre 01/16/2023   Influenza,inj,Quad PF,6+ Mos 01/02/2018, 01/02/2022   Tdap 01/02/2018    Health Maintenance  Topic Date Due   Cervical Cancer Screening (HPV/Pap Cotest)  07/20/2017   COVID-19 Vaccine (1 - 2023-24 season) Never done   Hepatitis C Screening  01/16/2024 (Originally 12/11/1996)   DTaP/Tdap/Td (2 - Td or Tdap) 01/03/2028   INFLUENZA VACCINE  Completed   HIV Screening  Completed   HPV VACCINES  Aged Out    Discussed health benefits of physical activity, and encouraged her to engage in regular exercise appropriate for her age and condition.  Routine general medical examination at a health care facility Normal physical exam findings today, she does continue to smoke, we briefly discussed smoking cessation however pt is not yet ready to do this. Counseled patient that when she is ready to let me know and we can discuss options. Counseled patient on healthy sleep habits, handouts given on healthy eating and exercise. Labs ordered for surveillance.  -     CBC with Differential/Platelet  Need for immunization against influenza -     Flu vaccine trivalent PF, 6mos and older(Flulaval,Afluria,Fluarix,Fluzone)  Primary hypertension -     Losartan Potassium; Take 1 tablet (50 mg total) by mouth daily.  Dispense: 90 tablet; Refill: 1 -     Comprehensive metabolic panel  Depression, major, single episode, moderate (HCC) -     FLUoxetine HCl; Take 1 capsule (40 mg total) by mouth daily.  Dispense: 90 capsule;  Refill: 1  Hyperlipidemia, mixed -     Lipid panel; Future    Return in 6 months (on 07/17/2023) for HTN.     Karie Georges, MD

## 2023-01-16 NOTE — Patient Instructions (Signed)

## 2023-04-05 DIAGNOSIS — F411 Generalized anxiety disorder: Secondary | ICD-10-CM | POA: Diagnosis not present

## 2023-04-05 DIAGNOSIS — F1020 Alcohol dependence, uncomplicated: Secondary | ICD-10-CM | POA: Diagnosis not present

## 2023-04-06 ENCOUNTER — Other Ambulatory Visit: Payer: Self-pay | Admitting: Family Medicine

## 2023-04-06 DIAGNOSIS — F321 Major depressive disorder, single episode, moderate: Secondary | ICD-10-CM

## 2023-04-08 ENCOUNTER — Telehealth: Payer: Self-pay

## 2023-04-08 ENCOUNTER — Ambulatory Visit (INDEPENDENT_AMBULATORY_CARE_PROVIDER_SITE_OTHER): Payer: 59

## 2023-04-08 ENCOUNTER — Ambulatory Visit
Admission: EM | Admit: 2023-04-08 | Discharge: 2023-04-08 | Disposition: A | Discharge status: Home / Self Care | Payer: 59 | Attending: Internal Medicine | Admitting: Internal Medicine

## 2023-04-08 DIAGNOSIS — S89302A Unspecified physeal fracture of lower end of left fibula, initial encounter for closed fracture: Secondary | ICD-10-CM | POA: Diagnosis not present

## 2023-04-08 DIAGNOSIS — M7989 Other specified soft tissue disorders: Secondary | ICD-10-CM | POA: Diagnosis not present

## 2023-04-08 DIAGNOSIS — F17200 Nicotine dependence, unspecified, uncomplicated: Secondary | ICD-10-CM | POA: Insufficient documentation

## 2023-04-08 NOTE — Discharge Instructions (Signed)
   Please follow up with ortho first thing tomorrow morning.

## 2023-04-08 NOTE — ED Triage Notes (Signed)
"  I slipped on the ice and hurt my left ankle on Friday night". "I slid on the ice, caught myself and rolled this ankle, my left knee does hurt some but did not fall on anything".

## 2023-04-09 DIAGNOSIS — S82841D Displaced bimalleolar fracture of right lower leg, subsequent encounter for closed fracture with routine healing: Secondary | ICD-10-CM | POA: Diagnosis not present

## 2023-04-12 DIAGNOSIS — G8918 Other acute postprocedural pain: Secondary | ICD-10-CM | POA: Diagnosis not present

## 2023-04-12 DIAGNOSIS — Y999 Unspecified external cause status: Secondary | ICD-10-CM | POA: Diagnosis not present

## 2023-04-12 DIAGNOSIS — S8362XA Sprain of the superior tibiofibular joint and ligament, left knee, initial encounter: Secondary | ICD-10-CM | POA: Diagnosis not present

## 2023-04-12 DIAGNOSIS — S8262XA Displaced fracture of lateral malleolus of left fibula, initial encounter for closed fracture: Secondary | ICD-10-CM | POA: Diagnosis not present

## 2023-04-12 DIAGNOSIS — X58XXXA Exposure to other specified factors, initial encounter: Secondary | ICD-10-CM | POA: Diagnosis not present

## 2023-04-12 NOTE — ED Provider Notes (Signed)
[facility]    CSN: [id number] Arrival date & time: 04/08/23  1324      History   Chief Complaint Chief Complaint  Patient presents with   Ankle Pain    HPI Maria Vega is a 43 y.o. female.   Patient here today for evaluation of left ankle injury that occurred a few nights ago.  She reports that she slid on ice called her cell that rolled her ankle.  She states that she has some mild left knee pain but nothing compared to her ankle pain.  She did not fall onto anything when she fell.  She denies any numbness or tingling.  Weightbearing worsens pain.  The history is provided by the patient.  Ankle Pain Associated symptoms: no fever     Past Medical History:  Diagnosis Date   AMA (advanced maternal age) primigravida 35+    Bilateral fibroadenomas of breasts    Depression    Herpes    History of shingles    Hx of varicella     Patient Active Problem List   Diagnosis Date Noted   Smoker 04/08/2023   HTN (hypertension) 11/01/2021   Intermittent palpitations 11/01/2021   Depression, major, single episode, moderate (HCC) 11/01/2021   Family history of breast cancer in first degree relative 11/01/2021   Abnormal cervical Papanicolaou smear 09/04/2019   Genital herpes simplex 09/04/2019   Tobacco use disorder 01/02/2018   Hyperlipidemia, mixed 01/02/2018   Left ovarian cyst 09/26/2016   SVD (spontaneous vaginal delivery) 06/06/2014   Post-dates pregnancy 06/04/2014    Past Surgical History:  Procedure Laterality Date   BREAST BIOPSY Right    BREAST SURGERY  2014   biopsy   DILATION AND CURETTAGE OF UTERUS  2000   FINGER SURGERY Right 1986   R index finger -tip amputed in door. Sewn up.   LAPAROTOMY N/A 09/26/2016   Procedure: LAPAROTOMY WITH RIGHT OVARIAN LUMPECTOMY, LEFT SALPINGOOPHORECTOMY WITH LEFT OVARIAN CYSTECTOMY AND PELVIC WASHINGS;  Surgeon: Zelphia Cairo, MD;  Location: [facility];  Service: Gynecology;  Laterality: N/A;  left  SALPINGO OOPHORECTOMY   SALPINGOOPHORECTOMY Left 09/26/2016   Procedure: SALPINGO OOPHORECTOMY;  Surgeon: Zelphia Cairo, MD;  Location: [facility];  Service: Gynecology;  Laterality: Left;   WISDOM TOOTH EXTRACTION  2002    OB History     Gravida  2   Para  1   Term  1   Preterm      AB  1   Living  1      SAB      IAB      Ectopic      Multiple  0   Live Births  1            Home Medications    Prior to Admission medications   Medication Sig Start Date End Date Taking? Authorizing Provider  FLUoxetine (PROZAC) 40 MG capsule TAKE 1 CAPSULE (40 MG TOTAL) BY MOUTH DAILY. 04/06/23  Yes Karie Georges, MD  losartan (COZAAR) 50 MG tablet Take 1 tablet (50 mg total) by mouth daily. 01/16/23  Yes Karie Georges, MD  norethindrone (MICRONOR) 0.35 MG tablet norethindrone (contraceptive) 0.35 mg tablet  TAKE 1 TABLET BY MOUTH EVERY DAY   Yes [provider]  NORETHINDRONE PO Take 1 tablet by mouth daily. 12/14/22   [provider]  valACYclovir (VALTREX) 500 MG tablet Take 500 mg by mouth daily as needed (outbreaks).    [provider]  Family History Family History  Problem Relation Age of Onset   Diabetes Mother    Cancer Mother        breast   Breast cancer Mother 30   Heart disease Father        6 vessel bypass, 2 aneurysms ( aorta and abd.)   Cancer Father        oral   Kidney disease Father     Social History Social History   Tobacco Use   Smoking status: Every Day    Current packs/day: 1.00    Average packs/day: 1 pack/day for 20.0 years (20.0 ttl pk-yrs)    Types: Cigarettes   Smokeless tobacco: Never  Vaping Use   Vaping status: Never Used  Substance Use Topics   Alcohol use: Yes    Alcohol/week: 4.0 standard drinks of alcohol    Types: 4 Cans of beer per week    Comment: Daily.   Drug use: Never     Allergies   Patient has no known allergies.   Review of Systems Review of Systems   Constitutional:  Negative for chills and fever.  Eyes:  Negative for discharge and redness.  Respiratory:  Negative for shortness of breath.   Gastrointestinal:  Negative for abdominal pain, nausea and vomiting.  Musculoskeletal:  Positive for arthralgias and joint swelling.  Skin:  Positive for color change. Negative for wound.  Neurological:  Negative for numbness.     Physical Exam Triage Vital Signs ED Triage Vitals  Encounter Vitals Group     BP 04/08/23 1443 (!) 185/107     Systolic BP Percentile --      Diastolic BP Percentile --      Pulse Rate 04/08/23 1443 67     Resp 04/08/23 1443 20     Temp 04/08/23 1443 97.6 F (36.4 C)     Temp Source 04/08/23 1443 Oral     SpO2 04/08/23 1443 97 %     Weight 04/08/23 1439 200 lb (90.7 kg)     Height 04/08/23 1439 5\' 7"  (1.702 m)     Head Circumference --      Peak Flow --      Pain Score 04/08/23 1436 10     Pain Loc --      Pain Education --      Exclude from Growth Chart --    No data found.  Updated Vital Signs BP (!) 183/103 (BP Location: Right Arm)   Pulse 67   Temp 97.6 F (36.4 C) (Oral)   Resp 20   Ht 5\' 7"  (1.702 m)   Wt 200 lb (90.7 kg)   LMP 02/16/2023 (Exact Date)   SpO2 97%   BMI 31.32 kg/m   Visual Acuity Right Eye Distance:   Left Eye Distance:   Bilateral Distance:    Right Eye Near:   Left Eye Near:    Bilateral Near:     Physical Exam Vitals and nursing note reviewed.  Constitutional:      General: She is not in acute distress.    Appearance: Normal appearance. She is not ill-appearing.  HENT:     Head: Normocephalic and atraumatic.  Eyes:     Conjunctiva/sclera: Conjunctivae normal.  Cardiovascular:     Rate and Rhythm: Normal rate.  Pulmonary:     Effort: Pulmonary effort is normal. No respiratory distress.  Musculoskeletal:     Comments: Moderate swelling present to lateral left ankle with bruising noted, TTP to lateral  malleolus diffusely. Decreased ROM of left ankle in all  directions due to pain.   Skin:    Capillary Refill: Normal cap refill to left toes Neurological:     Mental Status: She is alert.     Comments: Gross sensation intact to left toes distally  Psychiatric:        Mood and Affect: Mood normal.        Behavior: Behavior normal.        Thought Content: Thought content normal.      UC Treatments / Results  Labs (all labs ordered are listed, but only abnormal results are displayed) Labs Reviewed - No data to display  EKG   Radiology No results found.  Procedures Procedures (including critical care time)  Medications Ordered in UC Medications - No data to display  Initial Impression / Assessment and Plan / UC Course  I have reviewed the triage vital signs and the nursing notes.  Pertinent labs & imaging results that were available during my care of the patient were reviewed by me and considered in my medical decision making (see chart for details).    Fracture noted on xray. Aircast applied in office, advised nonweightbearing (patient has personal crutches) Recommended follow up with ortho as soon as possible.   Final Clinical Impressions(s) / UC Diagnoses   Final diagnoses:  Displaced physeal fracture of distal end of left fibula, initial encounter     Discharge Instructions        Please follow up with ortho first thing tomorrow morning.     ED Prescriptions   None    PDMP not reviewed this encounter.   Tomi Bamberger, PA-C 04/12/23 [phone]

## 2023-04-18 DIAGNOSIS — F411 Generalized anxiety disorder: Secondary | ICD-10-CM | POA: Diagnosis not present

## 2023-04-18 DIAGNOSIS — F1020 Alcohol dependence, uncomplicated: Secondary | ICD-10-CM | POA: Diagnosis not present

## 2023-04-24 DIAGNOSIS — F411 Generalized anxiety disorder: Secondary | ICD-10-CM | POA: Diagnosis not present

## 2023-04-24 DIAGNOSIS — F1020 Alcohol dependence, uncomplicated: Secondary | ICD-10-CM | POA: Diagnosis not present

## 2023-04-26 DIAGNOSIS — Z1151 Encounter for screening for human papillomavirus (HPV): Secondary | ICD-10-CM | POA: Diagnosis not present

## 2023-04-26 DIAGNOSIS — Z01419 Encounter for gynecological examination (general) (routine) without abnormal findings: Secondary | ICD-10-CM | POA: Diagnosis not present

## 2023-04-26 DIAGNOSIS — Z1231 Encounter for screening mammogram for malignant neoplasm of breast: Secondary | ICD-10-CM | POA: Diagnosis not present

## 2023-04-26 DIAGNOSIS — Z124 Encounter for screening for malignant neoplasm of cervix: Secondary | ICD-10-CM | POA: Diagnosis not present

## 2023-04-26 DIAGNOSIS — Z3040 Encounter for surveillance of contraceptives, unspecified: Secondary | ICD-10-CM | POA: Diagnosis not present

## 2023-04-26 DIAGNOSIS — Z1239 Encounter for other screening for malignant neoplasm of breast: Secondary | ICD-10-CM | POA: Diagnosis not present

## 2023-04-26 LAB — HM MAMMOGRAPHY

## 2023-04-27 ENCOUNTER — Other Ambulatory Visit: Payer: Self-pay | Admitting: Obstetrics and Gynecology

## 2023-04-27 DIAGNOSIS — Z1239 Encounter for other screening for malignant neoplasm of breast: Secondary | ICD-10-CM

## 2023-05-02 ENCOUNTER — Other Ambulatory Visit: Payer: Self-pay | Admitting: Obstetrics and Gynecology

## 2023-05-02 DIAGNOSIS — R928 Other abnormal and inconclusive findings on diagnostic imaging of breast: Secondary | ICD-10-CM

## 2023-05-11 DIAGNOSIS — F1020 Alcohol dependence, uncomplicated: Secondary | ICD-10-CM | POA: Diagnosis not present

## 2023-05-11 DIAGNOSIS — F411 Generalized anxiety disorder: Secondary | ICD-10-CM | POA: Diagnosis not present

## 2023-05-15 ENCOUNTER — Ambulatory Visit: Payer: 59

## 2023-05-15 ENCOUNTER — Ambulatory Visit
Admission: RE | Admit: 2023-05-15 | Discharge: 2023-05-15 | Disposition: A | Discharge status: Home / Self Care | Payer: 59 | Source: Ambulatory Visit | Attending: Obstetrics and Gynecology | Admitting: Obstetrics and Gynecology

## 2023-05-15 DIAGNOSIS — R928 Other abnormal and inconclusive findings on diagnostic imaging of breast: Secondary | ICD-10-CM | POA: Diagnosis not present

## 2023-05-18 DIAGNOSIS — F1020 Alcohol dependence, uncomplicated: Secondary | ICD-10-CM | POA: Diagnosis not present

## 2023-05-18 DIAGNOSIS — F411 Generalized anxiety disorder: Secondary | ICD-10-CM | POA: Diagnosis not present

## 2023-05-25 DIAGNOSIS — F411 Generalized anxiety disorder: Secondary | ICD-10-CM | POA: Diagnosis not present

## 2023-05-25 DIAGNOSIS — F1020 Alcohol dependence, uncomplicated: Secondary | ICD-10-CM | POA: Diagnosis not present

## 2023-06-06 ENCOUNTER — Other Ambulatory Visit: Payer: 59

## 2023-06-06 DIAGNOSIS — F411 Generalized anxiety disorder: Secondary | ICD-10-CM | POA: Diagnosis not present

## 2023-06-06 DIAGNOSIS — F1020 Alcohol dependence, uncomplicated: Secondary | ICD-10-CM | POA: Diagnosis not present

## 2023-06-15 DIAGNOSIS — F1020 Alcohol dependence, uncomplicated: Secondary | ICD-10-CM | POA: Diagnosis not present

## 2023-06-15 DIAGNOSIS — F411 Generalized anxiety disorder: Secondary | ICD-10-CM | POA: Diagnosis not present

## 2023-06-21 DIAGNOSIS — F1020 Alcohol dependence, uncomplicated: Secondary | ICD-10-CM | POA: Diagnosis not present

## 2023-06-21 DIAGNOSIS — F411 Generalized anxiety disorder: Secondary | ICD-10-CM | POA: Diagnosis not present

## 2023-06-28 DIAGNOSIS — F411 Generalized anxiety disorder: Secondary | ICD-10-CM | POA: Diagnosis not present

## 2023-06-28 DIAGNOSIS — F1020 Alcohol dependence, uncomplicated: Secondary | ICD-10-CM | POA: Diagnosis not present

## 2023-07-18 DIAGNOSIS — F411 Generalized anxiety disorder: Secondary | ICD-10-CM | POA: Diagnosis not present

## 2023-07-18 DIAGNOSIS — F1020 Alcohol dependence, uncomplicated: Secondary | ICD-10-CM | POA: Diagnosis not present

## 2023-08-03 DIAGNOSIS — F411 Generalized anxiety disorder: Secondary | ICD-10-CM | POA: Diagnosis not present

## 2023-08-03 DIAGNOSIS — F1020 Alcohol dependence, uncomplicated: Secondary | ICD-10-CM | POA: Diagnosis not present

## 2023-08-09 DIAGNOSIS — F411 Generalized anxiety disorder: Secondary | ICD-10-CM | POA: Diagnosis not present

## 2023-08-09 DIAGNOSIS — F1020 Alcohol dependence, uncomplicated: Secondary | ICD-10-CM | POA: Diagnosis not present

## 2023-08-23 DIAGNOSIS — F1020 Alcohol dependence, uncomplicated: Secondary | ICD-10-CM | POA: Diagnosis not present

## 2023-08-23 DIAGNOSIS — F411 Generalized anxiety disorder: Secondary | ICD-10-CM | POA: Diagnosis not present

## 2023-08-26 ENCOUNTER — Other Ambulatory Visit: Payer: Self-pay | Admitting: Family Medicine

## 2023-08-26 DIAGNOSIS — I10 Essential (primary) hypertension: Secondary | ICD-10-CM

## 2023-08-30 DIAGNOSIS — F1020 Alcohol dependence, uncomplicated: Secondary | ICD-10-CM | POA: Diagnosis not present

## 2023-08-30 DIAGNOSIS — F411 Generalized anxiety disorder: Secondary | ICD-10-CM | POA: Diagnosis not present

## 2023-09-06 DIAGNOSIS — F411 Generalized anxiety disorder: Secondary | ICD-10-CM | POA: Diagnosis not present

## 2023-09-06 DIAGNOSIS — F1020 Alcohol dependence, uncomplicated: Secondary | ICD-10-CM | POA: Diagnosis not present

## 2023-09-18 ENCOUNTER — Other Ambulatory Visit: Payer: Self-pay | Admitting: Family Medicine

## 2023-09-18 DIAGNOSIS — I10 Essential (primary) hypertension: Secondary | ICD-10-CM

## 2023-09-19 DIAGNOSIS — F411 Generalized anxiety disorder: Secondary | ICD-10-CM | POA: Diagnosis not present

## 2023-09-19 DIAGNOSIS — F1020 Alcohol dependence, uncomplicated: Secondary | ICD-10-CM | POA: Diagnosis not present

## 2023-09-25 DIAGNOSIS — F1020 Alcohol dependence, uncomplicated: Secondary | ICD-10-CM | POA: Diagnosis not present

## 2023-09-25 DIAGNOSIS — F411 Generalized anxiety disorder: Secondary | ICD-10-CM | POA: Diagnosis not present

## 2023-10-12 ENCOUNTER — Other Ambulatory Visit: Payer: Self-pay | Admitting: Family Medicine

## 2023-10-12 DIAGNOSIS — F321 Major depressive disorder, single episode, moderate: Secondary | ICD-10-CM

## 2023-10-17 DIAGNOSIS — F1020 Alcohol dependence, uncomplicated: Secondary | ICD-10-CM | POA: Diagnosis not present

## 2023-10-17 DIAGNOSIS — F411 Generalized anxiety disorder: Secondary | ICD-10-CM | POA: Diagnosis not present

## 2023-10-21 ENCOUNTER — Other Ambulatory Visit: Payer: Self-pay | Admitting: Family Medicine

## 2023-10-21 DIAGNOSIS — I10 Essential (primary) hypertension: Secondary | ICD-10-CM

## 2023-10-24 DIAGNOSIS — F411 Generalized anxiety disorder: Secondary | ICD-10-CM | POA: Diagnosis not present

## 2023-10-24 DIAGNOSIS — F1020 Alcohol dependence, uncomplicated: Secondary | ICD-10-CM | POA: Diagnosis not present

## 2023-10-31 DIAGNOSIS — F411 Generalized anxiety disorder: Secondary | ICD-10-CM | POA: Diagnosis not present

## 2023-10-31 DIAGNOSIS — F1020 Alcohol dependence, uncomplicated: Secondary | ICD-10-CM | POA: Diagnosis not present

## 2023-11-08 DIAGNOSIS — F1020 Alcohol dependence, uncomplicated: Secondary | ICD-10-CM | POA: Diagnosis not present

## 2023-11-08 DIAGNOSIS — F411 Generalized anxiety disorder: Secondary | ICD-10-CM | POA: Diagnosis not present

## 2023-11-15 DIAGNOSIS — F411 Generalized anxiety disorder: Secondary | ICD-10-CM | POA: Diagnosis not present

## 2023-11-15 DIAGNOSIS — F1020 Alcohol dependence, uncomplicated: Secondary | ICD-10-CM | POA: Diagnosis not present

## 2023-12-10 DIAGNOSIS — F1020 Alcohol dependence, uncomplicated: Secondary | ICD-10-CM | POA: Diagnosis not present

## 2023-12-10 DIAGNOSIS — F411 Generalized anxiety disorder: Secondary | ICD-10-CM | POA: Diagnosis not present

## 2023-12-26 DIAGNOSIS — F411 Generalized anxiety disorder: Secondary | ICD-10-CM | POA: Diagnosis not present

## 2023-12-26 DIAGNOSIS — F1020 Alcohol dependence, uncomplicated: Secondary | ICD-10-CM | POA: Diagnosis not present

## 2024-01-03 DIAGNOSIS — F411 Generalized anxiety disorder: Secondary | ICD-10-CM | POA: Diagnosis not present

## 2024-01-03 DIAGNOSIS — F1020 Alcohol dependence, uncomplicated: Secondary | ICD-10-CM | POA: Diagnosis not present

## 2024-01-09 DIAGNOSIS — F411 Generalized anxiety disorder: Secondary | ICD-10-CM | POA: Diagnosis not present

## 2024-01-09 DIAGNOSIS — F1020 Alcohol dependence, uncomplicated: Secondary | ICD-10-CM | POA: Diagnosis not present

## 2024-01-23 DIAGNOSIS — F411 Generalized anxiety disorder: Secondary | ICD-10-CM | POA: Diagnosis not present

## 2024-01-23 DIAGNOSIS — F1020 Alcohol dependence, uncomplicated: Secondary | ICD-10-CM | POA: Diagnosis not present

## 2024-02-01 DIAGNOSIS — F411 Generalized anxiety disorder: Secondary | ICD-10-CM | POA: Diagnosis not present

## 2024-02-01 DIAGNOSIS — F1020 Alcohol dependence, uncomplicated: Secondary | ICD-10-CM | POA: Diagnosis not present

## 2024-02-07 DIAGNOSIS — F1020 Alcohol dependence, uncomplicated: Secondary | ICD-10-CM | POA: Diagnosis not present

## 2024-02-07 DIAGNOSIS — F411 Generalized anxiety disorder: Secondary | ICD-10-CM | POA: Diagnosis not present

## 2024-02-20 DIAGNOSIS — F411 Generalized anxiety disorder: Secondary | ICD-10-CM | POA: Diagnosis not present

## 2024-02-20 DIAGNOSIS — F1020 Alcohol dependence, uncomplicated: Secondary | ICD-10-CM | POA: Diagnosis not present

## 2024-02-27 DIAGNOSIS — F411 Generalized anxiety disorder: Secondary | ICD-10-CM | POA: Diagnosis not present

## 2024-02-27 DIAGNOSIS — F1020 Alcohol dependence, uncomplicated: Secondary | ICD-10-CM | POA: Diagnosis not present

## 2024-03-04 DIAGNOSIS — N390 Urinary tract infection, site not specified: Secondary | ICD-10-CM | POA: Diagnosis not present

## 2024-03-04 DIAGNOSIS — N771 Vaginitis, vulvitis and vulvovaginitis in diseases classified elsewhere: Secondary | ICD-10-CM | POA: Diagnosis not present

## 2024-03-04 DIAGNOSIS — R309 Painful micturition, unspecified: Secondary | ICD-10-CM | POA: Diagnosis not present

## 2024-03-04 DIAGNOSIS — N760 Acute vaginitis: Secondary | ICD-10-CM | POA: Diagnosis not present

## 2024-03-14 ENCOUNTER — Telehealth (INDEPENDENT_AMBULATORY_CARE_PROVIDER_SITE_OTHER): Payer: Self-pay

## 2024-03-14 DIAGNOSIS — I10 Essential (primary) hypertension: Secondary | ICD-10-CM

## 2024-03-14 NOTE — Progress Notes (Signed)
" ° °  03/14/2024  Patient ID: Maria Vega, female   DOB: 29-Aug-1980, 43 y.o.   MRN: [id number]  Pharmacy Quality Measure Review  This patient is appearing on a report for being at risk of failing the Controlling Blood Pressure measure this calendar year.   Last documented BP 180/103 on 04/08/23  Elevated BP was from a bone fracture. Patient reports she checks her BP regularly at home and has been concerned with low BP readings, was at 70/50 Felt faint at this time.. Patient has self-stopped her Losartan  due to this. Is now feeling better.  Is past due for f/u with PCP. Have scheduled. Recommended patient check BP regularly at home between now and f/u and bring log of readings to visit.   Jon VEAR [place], PharmD Clinical Pharmacist [phone]   "

## 2024-04-01 ENCOUNTER — Encounter: Payer: Self-pay | Admitting: Family Medicine

## 2024-04-01 ENCOUNTER — Ambulatory Visit: Admitting: Family Medicine

## 2024-04-01 VITALS — BP 104/74 | HR 76 | Temp 98.6°F | Ht 66.75 in | Wt 191.6 lb

## 2024-04-01 DIAGNOSIS — Z23 Encounter for immunization: Secondary | ICD-10-CM

## 2024-04-01 DIAGNOSIS — I10 Essential (primary) hypertension: Secondary | ICD-10-CM

## 2024-04-01 DIAGNOSIS — E782 Mixed hyperlipidemia: Secondary | ICD-10-CM | POA: Diagnosis not present

## 2024-04-01 DIAGNOSIS — Z0000 Encounter for general adult medical examination without abnormal findings: Secondary | ICD-10-CM | POA: Diagnosis not present

## 2024-04-01 DIAGNOSIS — Z1211 Encounter for screening for malignant neoplasm of colon: Secondary | ICD-10-CM

## 2024-04-01 DIAGNOSIS — N912 Amenorrhea, unspecified: Secondary | ICD-10-CM

## 2024-04-01 DIAGNOSIS — F17200 Nicotine dependence, unspecified, uncomplicated: Secondary | ICD-10-CM | POA: Diagnosis not present

## 2024-04-01 DIAGNOSIS — F321 Major depressive disorder, single episode, moderate: Secondary | ICD-10-CM

## 2024-04-01 LAB — COMPREHENSIVE METABOLIC PANEL WITH GFR
ALT: 83 U/L — ABNORMAL HIGH (ref 3–35)
AST: 100 U/L — ABNORMAL HIGH (ref 5–37)
Albumin: 4.1 g/dL (ref 3.5–5.2)
Alkaline Phosphatase: 78 U/L (ref 39–117)
BUN: 9 mg/dL (ref 6–23)
CO2: 28 meq/L (ref 19–32)
Calcium: 9.5 mg/dL (ref 8.4–10.5)
Chloride: 100 meq/L (ref 96–112)
Creatinine, Ser: 0.9 mg/dL (ref 0.40–1.20)
GFR: 77.32 mL/min
Glucose, Bld: 81 mg/dL (ref 70–99)
Potassium: 4.1 meq/L (ref 3.5–5.1)
Sodium: 140 meq/L (ref 135–145)
Total Bilirubin: 0.6 mg/dL (ref 0.2–1.2)
Total Protein: 7.1 g/dL (ref 6.0–8.3)

## 2024-04-01 LAB — LIPID PANEL
Cholesterol: 299 mg/dL — ABNORMAL HIGH (ref 28–200)
HDL: 48 mg/dL
LDL Cholesterol: 181 mg/dL — ABNORMAL HIGH (ref 10–99)
NonHDL: 251.45
Total CHOL/HDL Ratio: 6
Triglycerides: 354 mg/dL — ABNORMAL HIGH (ref 10.0–149.0)
VLDL: 70.8 mg/dL — ABNORMAL HIGH (ref 0.0–40.0)

## 2024-04-01 LAB — CBC WITH DIFFERENTIAL/PLATELET
Basophils Absolute: 0.1 K/uL (ref 0.0–0.1)
Basophils Relative: 1.1 % (ref 0.0–3.0)
Eosinophils Absolute: 0.2 K/uL (ref 0.0–0.7)
Eosinophils Relative: 3.7 % (ref 0.0–5.0)
HCT: 38.7 % (ref 36.0–46.0)
Hemoglobin: 13.7 g/dL (ref 12.0–15.0)
Lymphocytes Relative: 32.6 % (ref 12.0–46.0)
Lymphs Abs: 1.9 K/uL (ref 0.7–4.0)
MCHC: 35.5 g/dL (ref 30.0–36.0)
MCV: 134.1 fl — ABNORMAL HIGH (ref 78.0–100.0)
Monocytes Absolute: 0.7 K/uL (ref 0.1–1.0)
Monocytes Relative: 12.2 % — ABNORMAL HIGH (ref 3.0–12.0)
Neutro Abs: 2.9 K/uL (ref 1.4–7.7)
Neutrophils Relative %: 50.4 % (ref 43.0–77.0)
Platelets: 223 K/uL (ref 150.0–400.0)
RBC: 2.88 Mil/uL — ABNORMAL LOW (ref 3.87–5.11)
RDW: 15.4 % (ref 11.5–15.5)
WBC: 5.8 K/uL (ref 4.0–10.5)

## 2024-04-01 MED ORDER — LOSARTAN POTASSIUM 25 MG PO TABS: 25.0000 mg | ORAL_TABLET | Freq: Every day | ORAL | 5 refills | Status: AC

## 2024-04-01 MED ORDER — PAROXETINE HCL 30 MG PO TABS: 30.0000 mg | ORAL_TABLET | Freq: Every day | ORAL | 5 refills | Status: DC

## 2024-04-01 MED ORDER — FLUOXETINE HCL 40 MG PO CAPS: 40.0000 mg | ORAL_CAPSULE | Freq: Every day | ORAL | 5 refills | Status: DC

## 2024-04-01 NOTE — Progress Notes (Signed)
 "  Complete physical exam  Patient: Maria Vega   DOB: 1980/12/24   44 y.o. Female  MRN: [id number]  Subjective:    Chief Complaint  Patient presents with   Annual Exam    Maria Vega is a 44 y.o. female who presents today for a complete physical exam. She reports consuming a general diet. Pt reports she eats fruits and veggies about twice a week, eats proteins daily. Tries to stay away from ultraprocessed foods, occasional snacks, does eat a lot of pasta Home exercise routine includes walking 1-2 hrs per week. She generally feels well. She reports sleeping somewhat poorly, has occasional difficulty falling asleep and is reporting hot flashes occasionally at night. She does have additional problems to discuss today.   Discussed the use of AI scribe software for clinical note transcription with the patient, who gave verbal consent to proceed.  History of Present Illness   Maria Vega is a 44 year old female who presents for an annual physical exam and follow-up on chronic conditions.  She reports perimenopausal symptoms with hot flashes, mood swings, brain fog, sleep disturbance, and night sweats. She is concerned about lack of periods while taking continuous norethindrone, which may explain her amenorrhea.  Regarding hypertension, she previously took losartan  50 mg daily but stopped about six weeks ago due to episodes of very low blood pressure. Her blood pressure then increased to the 160s to 170s, so she restarted losartan  at half a tablet daily.  She smokes about one pack per day and has made multiple quit attempts. She did not tolerate Chantix  in the past.  Her major depression is controlled on fluoxetine  40 mg daily.  She has no family history of colon cancer and is considering colon cancer screening options.      Most recent fall risk assessment:     No data to display           Most recent depression screenings:    04/01/2024    9:58 AM  01/16/2023   10:28 AM  PHQ 2/9 Scores  PHQ - 2 Score 2 0  PHQ- 9 Score 5 2      Data saved with a previous flowsheet row definition    Vision:Within last year and Dr. Jama at [facility] [address] and Dental: No current dental problems, No regular dental care , and Last dental visit: 1 year ago  Patient Active Problem List   Diagnosis Date Noted   Smoker 04/08/2023   HTN (hypertension) 11/01/2021   Intermittent palpitations 11/01/2021   Depression, major, single episode, moderate (HCC) 11/01/2021   Family history of breast cancer in first degree relative 11/01/2021   Abnormal cervical Papanicolaou smear 09/04/2019   Genital herpes simplex 09/04/2019   Tobacco use disorder 01/02/2018   Hyperlipidemia, mixed 01/02/2018   Left ovarian cyst 09/26/2016   SVD (spontaneous vaginal delivery) 06/06/2014   Post-dates pregnancy 06/04/2014      Patient Care Team: Ozell Heron HERO, MD as PCP - General (Family Medicine)   Show/hide medication list[1]  Review of Systems  HENT:  Negative for hearing loss.   Eyes:  Negative for blurred vision.  Respiratory:  Negative for shortness of breath.   Cardiovascular:  Negative for chest pain.  Gastrointestinal: Negative.   Genitourinary: Negative.   Musculoskeletal:  Negative for back pain.  Neurological:  Negative for headaches.  Endo/Heme/Allergies:        No periods for over 1 year  Psychiatric/Behavioral:  Negative for depression.  All other systems reviewed and are negative.      Objective:     BP 104/74   Pulse 76   Temp 98.6 F (37 C) (Oral)   Ht 5' 6.75 (1.695 m)   Wt 191 lb 9.6 oz (86.9 kg)   SpO2 98%   BMI 30.23 kg/m    Physical Exam Vitals reviewed.  Constitutional:      Appearance: Normal appearance. She is well-groomed. She is obese.  HENT:     Right Ear: Tympanic membrane and ear canal normal.     Left Ear: Tympanic membrane and ear canal normal.     Mouth/Throat:     Mouth: Mucous membranes are moist.      Pharynx: No posterior oropharyngeal erythema.  Eyes:     Conjunctiva/sclera: Conjunctivae normal.  Neck:     Thyroid : No thyromegaly.  Cardiovascular:     Rate and Rhythm: Normal rate and regular rhythm.     Pulses: Normal pulses.     Heart sounds: S1 normal and S2 normal.  Pulmonary:     Effort: Pulmonary effort is normal.     Breath sounds: Normal breath sounds and air entry.  Abdominal:     General: Abdomen is flat. Bowel sounds are normal.     Palpations: Abdomen is soft.  Musculoskeletal:     Right lower leg: No edema.     Left lower leg: No edema.  Lymphadenopathy:     Cervical: No cervical adenopathy.  Neurological:     Mental Status: She is alert and oriented to person, place, and time. Mental status is at baseline.     Gait: Gait is intact.  Psychiatric:        Mood and Affect: Mood and affect normal.        Speech: Speech normal.        Behavior: Behavior normal.        Judgment: Judgment normal.      No results found for any visits on 04/01/24.     Assessment & Plan:    Routine Health Maintenance and Physical Exam  Immunization History  Administered Date(s) Administered   Influenza, Seasonal, Injecte, Preservative Fre 01/16/2023, 04/01/2024   Influenza,inj,Quad PF,6+ Mos 01/02/2018, 01/02/2022   Influenza-Unspecified 12/23/2022   Tdap 01/02/2018    Health Maintenance  Topic Date Due   Hepatitis C Screening  Never done   Pneumococcal Vaccine (1 of 2 - PCV) Never done   Hepatitis B Vaccines 19-59 Average Risk (1 of 3 - 19+ 3-dose series) Never done   HPV VACCINES (1 - 3-dose SCDM series) Never done   Cervical Cancer Screening (HPV/Pap Cotest)  07/20/2017   Mammogram  08/29/2019   COVID-19 Vaccine (1 - 2025-26 season) Never done   Colonoscopy  Never done   DTaP/Tdap/Td (2 - Td or Tdap) 01/03/2028   Influenza Vaccine  Completed   HIV Screening  Completed   Meningococcal B Vaccine  Aged Out   Smoking/Tobacco Cessation Counseling Maria Vega is a current user of tobacco or nicotine products. She is considering quitting at this time. Counseling provided today addressed the risks of continued use and the benefits of cessation. Discussed tobacco/nicotine use history, readiness to quit, and evidence-based treatment options including behavioral strategies, support resources, and pharmacologic therapies. Provided encouragement and educational materials on steps and resources to quit smoking. Patient questions were addressed, and follow-up recommended for continued support. Total time spent on counseling: 5 minutes.   Discussed health benefits of physical  activity, and encouraged her to engage in regular exercise appropriate for her age and condition.  Routine adult health maintenance General physical exam findings are normal today. I reviewed the patient's preventative testing, immunizations, and lifestyle habits. I made appropriate recommendations and placed orders for the appropriate tests and/or vaccinations. I counseled the patient on the CDC's recommendations for healthy exercise and diet. I counseled the patient on healthy sleep habits and stress management. Handouts to reinforce the counseling were given at the conclusion of the visit.   Primary hypertension -     Losartan  Potassium; Take 1 tablet (25 mg total) by mouth daily.  Dispense: 30 tablet; Refill: 5 -     CBC with Differential/Platelet; Future -     Comprehensive metabolic panel with GFR; Future  Hyperlipidemia, mixed -     Lipid panel; Future  Depression, major, single episode, moderate (HCC) -     PARoxetine  HCl; Take 1 tablet (30 mg total) by mouth daily.  Dispense: 30 tablet; Refill: 5  Colon cancer screening -     Cologuard  Amenorrhea -     Estradiol ; Future  Immunization due -     Flu vaccine trivalent PF, 6mos and older(Flulaval,Afluria,Fluarix,Fluzone)  Assessment and Plan    Primary hypertension Blood pressure is well controlled on half tablet of  losartan  with current reading of 104/74 mmHg. - Continue losartan  at half tablet daily.  Mixed hyperlipidemia Overall heart risk is very low. Statin medication has not been started. - Ordered lipid panel.  Major depressive disorder, single episode, moderate Currently managed with fluoxetine  40 mg daily, which is effective. Plan to switch to paroxetine  30 mg daily to help with perimenopausal symptoms. - Switched fluoxetine  40 mg to paroxetine  30 mg once daily.  Amenorrhea and perimenopausal symptoms Experiencing amenorrhea and perimenopausal symptoms including hot flashes, mood swings, brain fog, sleep disturbance, and night sweats. Currently on norethindrone, which may be contributing to amenorrhea. Discussed hormone replacement therapy and its risks, including increased risk of blood clots in smokers. Recommended black cohosh as an herbal supplement. - Switched fluoxetine  to paroxetine  to help with perimenopausal symptoms. - Recommended black cohosh as an herbal supplement.  Nicotine dependence, cigarettes Continues to smoke one pack per day. Has tried multiple times to quit and did not tolerate Chantix  well. Counseled on smoking cessation options including nicotine replacement therapy. Discussed increased risk of blood clots with hormone replacement therapy in smokers. - Counseled on smoking cessation options including nicotine replacement therapy.  General Health Maintenance Agreeable to trying Cologuard test for colon cancer screening. No family history of colon cancer. - Ordered Cologuard test for colon cancer screening.        Return in 1 year (on 04/01/2025) for annual physical exam.     Heron CHRISTELLA Sharper, MD     [1]  Outpatient Medications Prior to Visit  Medication Sig   norethindrone (MICRONOR) 0.35 MG tablet norethindrone (contraceptive) 0.35 mg tablet  TAKE 1 TABLET BY MOUTH EVERY DAY   [DISCONTINUED] FLUoxetine  (PROZAC ) 40 MG capsule TAKE 1 CAPSULE (40 MG TOTAL) BY  MOUTH DAILY.   [DISCONTINUED] losartan  (COZAAR ) 50 MG tablet TAKE 1 TABLET BY MOUTH EVERY DAY   [DISCONTINUED] NORETHINDRONE PO Take 1 tablet by mouth daily.   [DISCONTINUED] valACYclovir (VALTREX) 500 MG tablet Take 500 mg by mouth daily as needed (outbreaks).   No facility-administered medications prior to visit.   "

## 2024-04-01 NOTE — Patient Instructions (Addendum)
 Herbal remedy for hot flashes -- [facility] Maintenance, Female Adopting a healthy lifestyle and getting preventive care are important in promoting health and wellness. Ask your health care provider about: The right schedule for you to have regular tests and exams. Things you can do on your own to prevent diseases and keep yourself healthy. What should I know about diet, weight, and exercise? Eat a healthy diet  Eat a diet that includes plenty of vegetables, fruits, low-fat dairy products, and lean protein. Do not eat a lot of foods that are high in solid fats, added sugars, or sodium. Maintain a healthy weight Body mass index (BMI) is used to identify weight problems. It estimates body fat based on height and weight. Your health care provider can help determine your BMI and help you achieve or maintain a healthy weight. Get regular exercise Get regular exercise. This is one of the most important things you can do for your health. Most adults should: Exercise for at least 150 minutes each week. The exercise should increase your heart rate and make you sweat (moderate-intensity exercise). Do strengthening exercises at least twice a week. This is in addition to the moderate-intensity exercise. Spend less time sitting. Even light physical activity can be beneficial. Watch cholesterol and blood lipids Have your blood tested for lipids and cholesterol at 46 years of age, then have this test every 5 years. Have your cholesterol levels checked more often if: Your lipid or cholesterol levels are high. You are older than 46 years of age. You are at high risk for heart disease. What should I know about cancer screening? Depending on your health history and family history, you may need to have cancer screening at various ages. This may include screening for: Breast cancer. Cervical cancer. Colorectal cancer. Skin cancer. Lung cancer. What should I know about heart disease, diabetes, and  high blood pressure? Blood pressure and heart disease High blood pressure causes heart disease and increases the risk of stroke. This is more likely to develop in people who have high blood pressure readings or are overweight. Have your blood pressure checked: Every 3-5 years if you are 77-60 years of age. Every year if you are 36 years old or older. Diabetes Have regular diabetes screenings. This checks your fasting blood sugar level. Have the screening done: Once every three years after age 11 if you are at a normal weight and have a low risk for diabetes. More often and at a younger age if you are overweight or have a high risk for diabetes. What should I know about preventing infection? Hepatitis B If you have a higher risk for hepatitis B, you should be screened for this virus. Talk with your health care provider to find out if you are at risk for hepatitis B infection. Hepatitis C Testing is recommended for: Everyone born from [birth year] through 1967. Anyone with known risk factors for hepatitis C. Sexually transmitted infections (STIs) Get screened for STIs, including gonorrhea and chlamydia, if: You are sexually active and are younger than 46 years of age. You are older than 46 years of age and your health care provider tells you that you are at risk for this type of infection. Your sexual activity has changed since you were last screened, and you are at increased risk for chlamydia or gonorrhea. Ask your health care provider if you are at risk. Ask your health care provider about whether you are at high risk for HIV. Your health care provider may  recommend a prescription medicine to help prevent HIV infection. If you choose to take medicine to prevent HIV, you should first get tested for HIV. You should then be tested every 3 months for as long as you are taking the medicine. Pregnancy If you are about to stop having your period (premenopausal) and you may become pregnant, seek counseling  before you get pregnant. Take 400 to 800 micrograms (mcg) of folic acid every day if you become pregnant. Ask for birth control (contraception) if you want to prevent pregnancy. Osteoporosis and menopause Osteoporosis is a disease in which the bones lose minerals and strength with aging. This can result in bone fractures. If you are 87 years old or older, or if you are at risk for osteoporosis and fractures, ask your health care provider if you should: Be screened for bone loss. Take a calcium  or vitamin D supplement to lower your risk of fractures. Be given hormone replacement therapy (HRT) to treat symptoms of menopause. Follow these instructions at home: Alcohol use Do not drink alcohol if: Your health care provider tells you not to drink. You are pregnant, may be pregnant, or are planning to become pregnant. If you drink alcohol: Limit how much you have to: 0-1 drink a day. Know how much alcohol is in your drink. In the [place] one drink equals one 12 oz bottle of beer (355 mL), one 5 oz glass of wine (148 mL), or one 1 oz glass of hard liquor (44 mL). Lifestyle Do not use any products that contain nicotine or tobacco. These products include cigarettes, chewing tobacco, and vaping devices, such as e-cigarettes. If you need help quitting, ask your health care provider. Do not use street drugs. Do not share needles. Ask your health care provider for help if you need support or information about quitting drugs. General instructions Schedule regular health, dental, and eye exams. Stay current with your vaccines. Tell your health care provider if: You often feel depressed. You have ever been abused or do not feel safe at home. Summary Adopting a healthy lifestyle and getting preventive care are important in promoting health and wellness. Follow your health care provider's instructions about healthy diet, exercising, and getting tested or screened for diseases. Follow your health care  provider's instructions on monitoring your cholesterol and blood pressure. This information is not intended to replace advice given to you by your health care provider. Make sure you discuss any questions you have with your health care provider. Document Revised: 07/26/2020 Document Reviewed: 07/26/2020 Elsevier Patient Education  2024 [employer].

## 2024-04-02 LAB — ESTRADIOL: Estradiol: <30 pg/mL

## 2024-04-03 ENCOUNTER — Ambulatory Visit: Payer: Self-pay | Admitting: Family Medicine

## 2024-04-24 ENCOUNTER — Other Ambulatory Visit: Payer: Self-pay | Admitting: Family Medicine

## 2024-04-24 DIAGNOSIS — F321 Major depressive disorder, single episode, moderate: Secondary | ICD-10-CM
